# Patient Record
Sex: Female | Born: 1985 | Race: White | Hispanic: No | Marital: Married | Smoking: Former smoker
Health system: Southern US, Community
[De-identification: ages and names within clinical notes are randomized; demographics above are authoritative.]

## PROBLEM LIST (undated history)

## (undated) DIAGNOSIS — Z808 Family history of malignant neoplasm of other organs or systems: Secondary | ICD-10-CM

## (undated) DIAGNOSIS — E063 Autoimmune thyroiditis: Secondary | ICD-10-CM

## (undated) DIAGNOSIS — Z1509 Genetic susceptibility to other malignant neoplasm: Secondary | ICD-10-CM

## (undated) DIAGNOSIS — Z8051 Family history of malignant neoplasm of kidney: Secondary | ICD-10-CM

## (undated) DIAGNOSIS — Z8042 Family history of malignant neoplasm of prostate: Secondary | ICD-10-CM

## (undated) DIAGNOSIS — E282 Polycystic ovarian syndrome: Secondary | ICD-10-CM

## (undated) DIAGNOSIS — Z803 Family history of malignant neoplasm of breast: Secondary | ICD-10-CM

## (undated) DIAGNOSIS — Z15060 Genetic susceptibility to colorectal cancer: Secondary | ICD-10-CM

## (undated) HISTORY — DX: Family history of malignant neoplasm of prostate: Z80.42

## (undated) HISTORY — DX: Family history of malignant neoplasm of kidney: Z80.51

## (undated) HISTORY — DX: Autoimmune thyroiditis: E06.3

## (undated) HISTORY — PX: TUBAL LIGATION: SHX77

## (undated) HISTORY — DX: Genetic susceptibility to other malignant neoplasm: Z15.09

## (undated) HISTORY — DX: Genetic susceptibility to colorectal cancer: Z15.060

## (undated) HISTORY — DX: Family history of malignant neoplasm of breast: Z80.3

## (undated) HISTORY — DX: Polycystic ovarian syndrome: E28.2

## (undated) HISTORY — PX: CHOLECYSTECTOMY, LAPAROSCOPIC: SHX56

## (undated) HISTORY — DX: Family history of malignant neoplasm of other organs or systems: Z80.8

---

## 2019-12-20 ENCOUNTER — Ambulatory Visit: Payer: Self-pay | Admitting: Dermatology

## 2019-12-20 ENCOUNTER — Other Ambulatory Visit: Payer: Self-pay

## 2020-05-03 ENCOUNTER — Ambulatory Visit: Payer: Managed Care, Other (non HMO) | Admitting: Family Medicine

## 2020-05-15 ENCOUNTER — Ambulatory Visit (INDEPENDENT_AMBULATORY_CARE_PROVIDER_SITE_OTHER): Payer: Managed Care, Other (non HMO) | Admitting: Family Medicine

## 2020-05-15 ENCOUNTER — Encounter: Payer: Self-pay | Admitting: Family Medicine

## 2020-05-15 ENCOUNTER — Other Ambulatory Visit: Payer: Self-pay

## 2020-05-15 VITALS — BP 127/75 | HR 79 | Temp 97.8°F | Ht 65.45 in | Wt 160.5 lb

## 2020-05-15 DIAGNOSIS — Z803 Family history of malignant neoplasm of breast: Secondary | ICD-10-CM | POA: Diagnosis not present

## 2020-05-15 DIAGNOSIS — Z13220 Encounter for screening for lipoid disorders: Secondary | ICD-10-CM | POA: Diagnosis not present

## 2020-05-15 DIAGNOSIS — E282 Polycystic ovarian syndrome: Secondary | ICD-10-CM

## 2020-05-15 MED ORDER — SEMAGLUTIDE-WEIGHT MANAGEMENT 1.7 MG/0.75ML ~~LOC~~ SOAJ: 1.7000 mg | SUBCUTANEOUS | 0 refills | Status: DC

## 2020-05-15 MED ORDER — SEMAGLUTIDE-WEIGHT MANAGEMENT 1 MG/0.5ML ~~LOC~~ SOAJ: 1.0000 mg | SUBCUTANEOUS | 0 refills | Status: DC

## 2020-05-15 MED ORDER — SEMAGLUTIDE-WEIGHT MANAGEMENT 0.25 MG/0.5ML ~~LOC~~ SOAJ: 0.2500 mg | SUBCUTANEOUS | 0 refills | Status: DC

## 2020-05-15 MED ORDER — SEMAGLUTIDE-WEIGHT MANAGEMENT 0.5 MG/0.5ML ~~LOC~~ SOAJ: 0.5000 mg | SUBCUTANEOUS | 0 refills | Status: DC

## 2020-05-15 MED ORDER — SEMAGLUTIDE-WEIGHT MANAGEMENT 2.4 MG/0.75ML ~~LOC~~ SOAJ: 2.4000 mg | SUBCUTANEOUS | 3 refills | Status: DC

## 2020-05-15 NOTE — Patient Instructions (Addendum)
https://www.[employer].com/wegovy/savings-card.html -[employer] Savings card.  Have labs completed today.  Schedule pap in 3-4 months at your convenience.   You can also stop downstairs and go ahead and schedule your mammogram.

## 2020-05-16 LAB — COMPLETE METABOLIC PANEL WITH GFR
AG Ratio: 1.8 (calc) (ref 1.0–2.5)
ALT: 19 U/L (ref 6–29)
AST: 16 U/L (ref 10–30)
Albumin: 4.6 g/dL (ref 3.6–5.1)
Alkaline phosphatase (APISO): 40 U/L (ref 31–125)
BUN: 10 mg/dL (ref 7–25)
CO2: 29 mmol/L (ref 20–32)
Calcium: 10 mg/dL (ref 8.6–10.2)
Chloride: 104 mmol/L (ref 98–110)
Creat: 0.74 mg/dL (ref 0.50–1.10)
GFR, Est African American: 121 mL/min/{1.73_m2} (ref >=60)
GFR, Est Non African American: 104 mL/min/{1.73_m2} (ref >=60)
Globulin: 2.6 g/dL (calc) (ref 1.9–3.7)
Glucose, Bld: 98 mg/dL (ref 65–139)
Potassium: 5.3 mmol/L (ref 3.5–5.3)
Sodium: 141 mmol/L (ref 135–146)
Total Bilirubin: 0.3 mg/dL (ref 0.2–1.2)
Total Protein: 7.2 g/dL (ref 6.1–8.1)

## 2020-05-16 LAB — HEMOGLOBIN A1C
Hgb A1c MFr Bld: 5.2 % of total Hgb (ref <5.7)
Mean Plasma Glucose: 103 (calc)
eAG (mmol/L): 5.7 (calc)

## 2020-05-16 LAB — CBC
HCT: 41.9 % (ref 35.0–45.0)
Hemoglobin: 14.2 g/dL (ref 11.7–15.5)
MCH: 28.3 pg (ref 27.0–33.0)
MCHC: 33.9 g/dL (ref 32.0–36.0)
MCV: 83.6 fL (ref 80.0–100.0)
MPV: 10.1 fL (ref 7.5–12.5)
Platelets: 321 10*3/uL (ref 140–400)
RBC: 5.01 10*6/uL (ref 3.80–5.10)
RDW: 12.7 % (ref 11.0–15.0)
WBC: 7.1 10*3/uL (ref 3.8–10.8)

## 2020-05-16 LAB — LIPID PANEL
Cholesterol: 226 mg/dL — ABNORMAL HIGH (ref <200)
HDL: 73 mg/dL (ref >=50)
LDL Cholesterol (Calc): 137 mg/dL (calc) — ABNORMAL HIGH
Non-HDL Cholesterol (Calc): 153 mg/dL (calc) — ABNORMAL HIGH (ref <130)
Total CHOL/HDL Ratio: 3.1 (calc) (ref <5.0)
Triglycerides: 70 mg/dL (ref <150)

## 2020-05-17 ENCOUNTER — Other Ambulatory Visit: Payer: Self-pay | Admitting: Family Medicine

## 2020-05-17 DIAGNOSIS — Z803 Family history of malignant neoplasm of breast: Secondary | ICD-10-CM | POA: Insufficient documentation

## 2020-05-17 DIAGNOSIS — Z13220 Encounter for screening for lipoid disorders: Secondary | ICD-10-CM | POA: Insufficient documentation

## 2020-05-17 DIAGNOSIS — E282 Polycystic ovarian syndrome: Secondary | ICD-10-CM | POA: Insufficient documentation

## 2020-05-17 MED ORDER — SAXENDA 18 MG/3ML ~~LOC~~ SOPN: PEN_INJECTOR | SUBCUTANEOUS | 2 refills | Status: DC

## 2020-05-17 NOTE — Assessment & Plan Note (Signed)
Lipid panel ordered.

## 2020-05-17 NOTE — Progress Notes (Signed)
Debbie Gallegos - 34 y.o. female MRN [id number]  Date of birth: 1986/03/17  Subjective Chief Complaint  Patient presents with  . Establish Care    HPI Debbie Gallegos is a 34 y.o. female here today for initial visit.  She has been in pretty good health with history of PCOS.  This has been managed with metformin.  Blood sugars have been pretty well controlled.  She denies side effects from metformin.  She has had difficulty controlling her weight in the past.  She has tried phentermine previously with short term success but has been unable to maintain weight loss for prolonged periods.    She also has a family history of breast cancer in mother and maternal aunt.  She has never seen a Dietitian and family members have not had BRCA testing to her knowledge.  She was advised to start having early mammograms and was scheduled to have one prior to moving to [facility] but didn't have this completed.    ROS:  A comprehensive ROS was completed and negative except as noted per HPI  Allergies  Allergen Reactions  . Phenergan [Promethazine Hcl] Other (See Comments)    Lock jaw, neck stiffness    Past Medical History:  Diagnosis Date  . PCOS (polycystic ovarian syndrome)     Past Surgical History:  Procedure Laterality Date  . CESAREAN SECTION    . CHOLECYSTECTOMY, LAPAROSCOPIC    . TUBAL LIGATION      Social History   Socioeconomic History  . Marital status: Married    Spouse name: Debbie Gallegos  . Number of children: 2  . Years of education: Not on file  . Highest education level: High school graduate  Occupational History  . Occupation: Stay At Home Mother  Tobacco Use  . Smoking status: Former Smoker    Types: Cigarettes    Quit date: 10/11/2015    Years since quitting: 4.6  . Smokeless tobacco: Never Used  Vaping Use  . Vaping Use: Never used  Substance and Sexual Activity  . Alcohol use: Yes    Alcohol/week: 1.0 - 2.0 standard drink    Types: 1 - 2 Standard drinks or  equivalent per week  . Drug use: Never  . Sexual activity: Yes    Partners: Male    Birth control/protection: None    Comment: Tubal Ligation  Other Topics Concern  . Not on file  Social History Narrative  . Not on file   Social Determinants of Health   Financial Resource Strain:   . Difficulty of Paying Living Expenses: Not on file  Food Insecurity:   . Worried About Charity fundraiser in the Last Year: Not on file  . Ran Out of Food in the Last Year: Not on file  Transportation Needs:   . Lack of Transportation (Medical): Not on file  . Lack of Transportation (Non-Medical): Not on file  Physical Activity:   . Days of Exercise per Week: Not on file  . Minutes of Exercise per Session: Not on file  Stress:   . Feeling of Stress : Not on file  Social Connections:   . Frequency of Communication with Friends and Family: Not on file  . Frequency of Social Gatherings with Friends and Family: Not on file  . Attends Religious Services: Not on file  . Active Member of Clubs or Organizations: Not on file  . Attends Archivist Meetings: Not on file  . Marital Status: Not on file  Family History  Problem Relation Age of Onset  . Hypertension Mother   . Diabetes Mother   . Stroke Mother   . Breast cancer Mother   . Skin cancer Father   . Uterine cancer Maternal Grandmother   . Kidney cancer Maternal Grandfather   . Breast cancer Maternal Aunt     Health Maintenance  Topic Date Due  . Hepatitis C Screening  Never done  . HIV Screening  Never done  . PAP SMEAR-Modifier  Never done  . INFLUENZA VACCINE  Never done  . TETANUS/TDAP  08/12/2025  . COVID-19 Vaccine  Completed     ----------------------------------------------------------------------------------------------------------------------------------------------------------------------------------------------------------------- Physical Exam BP 127/75 (BP Location: Left Arm, Patient Position: Sitting, Cuff  Size: Normal)   Pulse 79   Temp 97.8 F (36.6 C)   Ht 5' 5.45" (1.662 m)   Wt 160 lb 8 oz (72.8 kg)   LMP 05/01/2020   SpO2 98%   BMI 26.34 kg/m   Physical Exam Constitutional:      Appearance: Normal appearance.  HENT:     Head: Normocephalic and atraumatic.  Eyes:     General: No scleral icterus. Cardiovascular:     Rate and Rhythm: Normal rate and regular rhythm.  Pulmonary:     Effort: Pulmonary effort is normal.     Breath sounds: Normal breath sounds.  Musculoskeletal:     Cervical back: Neck supple.  Skin:    General: Skin is warm and dry.  Neurological:     General: No focal deficit present.     Mental Status: She is alert.  Psychiatric:        Mood and Affect: Mood normal.        Behavior: Behavior normal.     ------------------------------------------------------------------------------------------------------------------------------------------------------------------------------------------------------------------- Assessment and Plan  PCOS (polycystic ovarian syndrome) Managed with metformin.  Has struggled to lose and maintain weight.   Medication options discussed.   Will provide trial of wegovy Updated labs ordered.   No follow-ups on file.   Family history of breast cancer in first degree relative Recommend referral to genetic counselor.  Start early screening for breast cancer, mammogram ordered.    Screening for lipid disorders Lipid panel ordered.   Orders Placed This Encounter  Procedures  . MM 3D SCREEN BREAST BILATERAL    Standing Status:   Future    Standing Expiration Date:   05/15/2021    Order Specific Question:   Reason for Exam (SYMPTOM  OR DIAGNOSIS REQUIRED)    Answer:   Family history of breast cancer in multiple relatives    Order Specific Question:   Is the patient pregnant?    Answer:   No    Order Specific Question:   Preferred imaging location?    Answer:   [facility]  . COMPLETE METABOLIC PANEL WITH GFR   . CBC  . Lipid Profile  . HgB A1c  . Ambulatory referral to Genetics    Referral Priority:   Routine    Referral Type:   Consultation    Referral Reason:   Specialty Services Required    Number of Visits Requested:   1     Meds ordered this encounter  Medications  . DISCONTD: Semaglutide-Weight Management 0.25 MG/0.5ML SOAJ    Sig: Inject 0.25 mg into the skin once a week for 28 days.    Dispense:  2 mL    Refill:  0  . DISCONTD: Semaglutide-Weight Management 0.5 MG/0.5ML SOAJ    Sig: Inject 0.5 mg into  the skin once a week for 28 days. Start after 0.62m completion    Dispense:  2 mL    Refill:  0  . DISCONTD: Semaglutide-Weight Management 1 MG/0.5ML SOAJ    Sig: Inject 1 mg into the skin once a week for 28 days.    Dispense:  2 mL    Refill:  0  . DISCONTD: Semaglutide-Weight Management 1.7 MG/0.75ML SOAJ    Sig: Inject 1.7 mg into the skin once a week for 28 days.    Dispense:  3 mL    Refill:  0  . DISCONTD: Semaglutide-Weight Management 2.4 MG/0.75ML SOAJ    Sig: Inject 2.4 mg into the skin once a week for 28 days.    Dispense:  3 mL    Refill:  3    No follow-ups on file.    This visit occurred during the SARS-CoV-2 public health emergency.  Safety protocols were in place, including screening questions prior to the visit, additional usage of staff PPE, and extensive cleaning of exam room while observing appropriate contact time as indicated for disinfecting solutions.

## 2020-05-17 NOTE — Assessment & Plan Note (Signed)
Managed with metformin.  Has struggled to lose and maintain weight.   Medication options discussed.   Will provide trial of wegovy Updated labs ordered.   No follow-ups on file.

## 2020-05-17 NOTE — Assessment & Plan Note (Signed)
Recommend referral to genetic counselor.  Start early screening for breast cancer, mammogram ordered.

## 2020-05-18 ENCOUNTER — Telehealth: Payer: Self-pay | Admitting: Genetic Counselor

## 2020-05-18 NOTE — Telephone Encounter (Signed)
Received a genetic counseling referral from Dr. Ashley Royalty for fhx of breast cancer. Mrs. Debbie Gallegos has been cld and scheduled to see Clydie Braun on 11/1 at 10am. Pt aware to arrive 15 minutes early.

## 2020-05-22 ENCOUNTER — Other Ambulatory Visit: Payer: Self-pay | Admitting: Family Medicine

## 2020-05-22 MED ORDER — PHENTERMINE HCL 37.5 MG PO CAPS: 37.5000 mg | ORAL_CAPSULE | ORAL | 2 refills | Status: DC

## 2020-05-24 ENCOUNTER — Other Ambulatory Visit: Payer: Self-pay

## 2020-05-24 ENCOUNTER — Ambulatory Visit: Payer: Managed Care, Other (non HMO)

## 2020-06-07 ENCOUNTER — Other Ambulatory Visit: Payer: Self-pay

## 2020-06-07 ENCOUNTER — Ambulatory Visit (INDEPENDENT_AMBULATORY_CARE_PROVIDER_SITE_OTHER): Payer: Managed Care, Other (non HMO)

## 2020-06-07 DIAGNOSIS — Z803 Family history of malignant neoplasm of breast: Secondary | ICD-10-CM

## 2020-06-07 DIAGNOSIS — Z1231 Encounter for screening mammogram for malignant neoplasm of breast: Secondary | ICD-10-CM

## 2020-06-12 ENCOUNTER — Inpatient Hospital Stay: Payer: Managed Care, Other (non HMO) | Attending: Genetic Counselor | Admitting: Genetic Counselor

## 2020-06-12 ENCOUNTER — Inpatient Hospital Stay: Payer: Managed Care, Other (non HMO)

## 2020-06-12 ENCOUNTER — Encounter: Payer: Self-pay | Admitting: Genetic Counselor

## 2020-06-12 ENCOUNTER — Other Ambulatory Visit: Payer: Self-pay

## 2020-06-12 DIAGNOSIS — Z803 Family history of malignant neoplasm of breast: Secondary | ICD-10-CM | POA: Diagnosis not present

## 2020-06-12 DIAGNOSIS — Z8051 Family history of malignant neoplasm of kidney: Secondary | ICD-10-CM

## 2020-06-12 DIAGNOSIS — Z8042 Family history of malignant neoplasm of prostate: Secondary | ICD-10-CM

## 2020-06-12 DIAGNOSIS — Z808 Family history of malignant neoplasm of other organs or systems: Secondary | ICD-10-CM | POA: Diagnosis not present

## 2020-06-12 NOTE — Progress Notes (Signed)
REFERRING PROVIDER: Luetta Nutting, [address] [place],  [place] [address]  PRIMARY PROVIDER:  Luetta Nutting, DO  PRIMARY REASON FOR VISIT:  1. Family history of breast cancer   2. Family history of kidney cancer   3. Family history of prostate cancer   4. Family history of melanoma      HISTORY OF PRESENT ILLNESS:   Ms. Chenette, a 34 y.o. female, was seen for a [place] cancer genetics consultation at the request of Dr. Zigmund Daniel due to a family history of cancer.  Ms. Spainhower presents to clinic today to discuss the possibility of a hereditary predisposition to cancer, genetic testing, and to further clarify her future cancer risks, as well as potential cancer risks for family members.   Ms. Sanville is a 34 y.o. female with no personal history of cancer.    CANCER HISTORY:  Oncology History   No history exists.     RISK FACTORS:  Menarche was at age 25.  First live birth at age 34.  OCP use for approximately <1 years.  Ovaries intact: yes.  Hysterectomy: no.  Menopausal status: premenopausal.  HRT use: 0 years. Colonoscopy: no; not examined. Mammogram within the last year: yes. Number of breast biopsies: 0. Up to date with pelvic exams: yes. Any excessive radiation exposure in the past: no  Past Medical History:  Diagnosis Date  . Family history of breast cancer   . Family history of kidney cancer   . Family history of melanoma   . Family history of prostate cancer   . PCOS (polycystic ovarian syndrome)     Past Surgical History:  Procedure Laterality Date  . CESAREAN SECTION    . CHOLECYSTECTOMY, LAPAROSCOPIC    . TUBAL LIGATION      Social History   Socioeconomic History  . Marital status: Married    Spouse name: Anderia Lorenzo  . Number of children: 2  . Years of education: Not on file  . Highest education level: High school graduate  Occupational History  . Occupation: Stay At Home Mother  Tobacco Use  . Smoking status:  Former Smoker    Types: Cigarettes    Quit date: 10/11/2015    Years since quitting: 4.6  . Smokeless tobacco: Never Used  Vaping Use  . Vaping Use: Never used  Substance and Sexual Activity  . Alcohol use: Yes    Alcohol/week: 1.0 - 2.0 standard drink    Types: 1 - 2 Standard drinks or equivalent per week  . Drug use: Never  . Sexual activity: Yes    Partners: Male    Birth control/protection: None    Comment: Tubal Ligation  Other Topics Concern  . Not on file  Social History Narrative  . Not on file   Social Determinants of Health   Financial Resource Strain:   . Difficulty of Paying Living Expenses: Not on file  Food Insecurity:   . Worried About Charity fundraiser in the Last Year: Not on file  . Ran Out of Food in the Last Year: Not on file  Transportation Needs:   . Lack of Transportation (Medical): Not on file  . Lack of Transportation (Non-Medical): Not on file  Physical Activity:   . Days of Exercise per Week: Not on file  . Minutes of Exercise per Session: Not on file  Stress:   . Feeling of Stress : Not on file  Social Connections:   . Frequency of  Communication with Friends and Family: Not on file  . Frequency of Social Gatherings with Friends and Family: Not on file  . Attends Religious Services: Not on file  . Active Member of Clubs or Organizations: Not on file  . Attends Archivist Meetings: Not on file  . Marital Status: Not on file     FAMILY HISTORY:  We obtained a detailed, 4-generation family history.  Significant diagnoses are listed below: Family History  Problem Relation Age of Onset  . Hypertension Mother   . Diabetes Mother   . Stroke Mother   . Breast cancer Mother 40       HER2+  . Melanoma Father        ? due to agent orange  . Other Father        cancerous nasal polyps  . Heart disease Maternal Grandmother   . Kidney cancer Maternal Grandfather 83       encapsulated  . Breast cancer Maternal Aunt 64  . Prostate  cancer Paternal Grandfather   . Uterine cancer Other 68       MGF's mother    The patient has a son and daughter who are cancer free. She has two brothers who are cancer free and a paternal half brother and sister who are cancer free.  Both parents are living.  The patient's father has had skin cancer for which he needed chemotherapy. He had a brother and sister who did not have cancer.  His father had prostate cancer and his mother is deceased.  The patient's mother had breast cancer at 20.  She has five brothers and two sisters.  One sister had breast cancer at age 66 and the other sister has had a lumpectomy, but it did not show cancer.  The maternal grandmother died of heart disease and the grandfather had kidney cancer at 95.  His mother had uterine cancer at 17.  Ms. Silberstein is unaware of previous family history of genetic testing for hereditary cancer risks. Patient's maternal ancestors are of [place] and [place] descent, and paternal ancestors are of [place] and [place] descent. There is no reported Ashkenazi Jewish ancestry. There is no known consanguinity.  GENETIC COUNSELING ASSESSMENT: Ms. Ulloa is a 34 y.o. female with a family history of cancer which is somewhat suggestive of a familial cancer syndrome, rather than a hereditary predisposition to cancer given the ages of onset of cancer in the family. We, therefore, discussed and recommended the following at today's visit.   DISCUSSION: We discussed that 5 - 10% of breast cancer is hereditary, with most cases associated with BRCA mutations.  There are other genes that can be associated with hereditary breast cancer syndromes.  These include ATM, CHEK2 and PALB2. We reviewed that her mother is a more informative person to test as her mother is the one with breast cancer.  Therefore, testing Ms. Pricer will give [place] information on her, but not about her family.     We discussed with Ms. Geissinger that the family history does not meet insurance  or NCCN criteria for genetic testing and, therefore, is not highly consistent with a familial hereditary cancer syndrome.  We feel she is at low risk to harbor a gene mutation associated with such a condition. Thus, we did not recommend any genetic testing, at this time, and recommended Ms. Arvie continue to follow the cancer screening guidelines given by her primary healthcare provider.  We did discuss that genetic testing could be performed,  but she would need to pay out of pocket.  This would cost $250.  We discussed that some people do not want to undergo genetic testing due to fear of genetic discrimination.  A federal law called the Genetic Information Non-Discrimination Act (GINA) of 2008 helps protect individuals against genetic discrimination based on their genetic test results.  It impacts both health insurance and employment.  With health insurance, it protects against increased premiums, being kicked off insurance or being forced to take a test in order to be insured.  For employment it protects against hiring, firing and promoting decisions based on genetic test results.  Health status due to a cancer diagnosis is not protected under GINA.   PLAN: Ms. Tomer did not wish to pursue genetic testing at today's visit. We understand this decision and remain available to coordinate genetic testing at any time in the future. We, therefore, recommend Ms. Rapley continue to follow the cancer screening guidelines given by her primary healthcare provider.  Based on Ms. Degante's family history, we recommended her mother, who was diagnosed with breast cancer at age 44, have genetic counseling and testing. Ms. Rodick will let us know if we can be of any assistance in coordinating genetic counseling and/or testing for this family member. She reports that her mother is scheduled for testing in February.  Lastly, we encouraged Ms. Shallenberger to remain in contact with cancer genetics annually so that we can continuously  update the family history and inform her of any changes in cancer genetics and testing that may be of benefit for this family.   Ms. Reitter questions were answered to her satisfaction today. Our contact information was provided should additional questions or concerns arise. Thank you for the referral and allowing [place] to share in the care of your patient.   Auriana Scalia P. Florene Glen, [place], [facility] Licensed, Insurance risk surveyor [email or web address] phone: [phone]  The patient was seen for a total of 35 minutes in face-to-face genetic counseling.  This patient was discussed with Drs. Magrinat, Lindi Adie and/or Burr Medico who agrees with the above.    _______________________________________________________________________ For Office Staff:  Number of people involved in session: 1 Was an Intern/ student involved with case: no

## 2020-06-27 ENCOUNTER — Other Ambulatory Visit: Payer: Self-pay

## 2020-06-27 MED ORDER — METFORMIN HCL 500 MG PO TABS: ORAL_TABLET | ORAL | 3 refills | Status: DC

## 2020-08-23 ENCOUNTER — Encounter: Payer: Self-pay | Admitting: Family Medicine

## 2021-01-29 ENCOUNTER — Encounter: Payer: Self-pay | Admitting: Family Medicine

## 2021-01-29 ENCOUNTER — Ambulatory Visit: Payer: Managed Care, Other (non HMO) | Admitting: Family Medicine

## 2021-01-29 DIAGNOSIS — M25569 Pain in unspecified knee: Secondary | ICD-10-CM | POA: Insufficient documentation

## 2021-01-29 DIAGNOSIS — M25561 Pain in right knee: Secondary | ICD-10-CM

## 2021-01-29 NOTE — Progress Notes (Signed)
Debbie Gallegos - 35 y.o. female MRN [id number]  Date of birth: Jun 14, 1986  Subjective Chief Complaint  Patient presents with   Knee Pain    HPI Debbie Gallegos is a 35 y.o. female here today with complaint of R knee pain.  She first noticed a few days ago.  Pain in located on the medial part of the knee.  She denies injury however she has recently started exercising more.   There is no swelling.  Pain is not too bothersome with weight bearing of movement.   ROS:  A comprehensive ROS was completed and negative except as noted per HPI  Allergies  Allergen Reactions   Phenergan [Promethazine Hcl] Other (See Comments)    Lock jaw, neck stiffness    Past Medical History:  Diagnosis Date   Family history of breast cancer    Family history of kidney cancer    Family history of melanoma    Family history of prostate cancer    PCOS (polycystic ovarian syndrome)     Past Surgical History:  Procedure Laterality Date   CESAREAN SECTION     CHOLECYSTECTOMY, LAPAROSCOPIC     TUBAL LIGATION      Social History   Socioeconomic History   Marital status: Married    Spouse name: Naeemah Jasmer   Number of children: 2   Years of education: Not on file   Highest education level: High school graduate  Occupational History   Occupation: Stay At Home Mother  Tobacco Use   Smoking status: Former    Pack years: 0.00    Types: Cigarettes    Quit date: 10/11/2015    Years since quitting: 5.3   Smokeless tobacco: Never  Vaping Use   Vaping Use: Never used  Substance and Sexual Activity   Alcohol use: Yes    Alcohol/week: 1.0 - 2.0 standard drink    Types: 1 - 2 Standard drinks or equivalent per week   Drug use: Never   Sexual activity: Yes    Partners: Male    Birth control/protection: None    Comment: Tubal Ligation  Other Topics Concern   Not on file  Social History Narrative   Not on file   Social Determinants of Health   Financial Resource Strain: Not on file  Food Insecurity:  Not on file  Transportation Needs: Not on file  Physical Activity: Not on file  Stress: Not on file  Social Connections: Not on file    Family History  Problem Relation Age of Onset   Hypertension Mother    Diabetes Mother    Stroke Mother    Breast cancer Mother 85       HER2+   Melanoma Father        ? due to agent orange   Other Father        cancerous nasal polyps   Heart disease Maternal Grandmother    Kidney cancer Maternal Grandfather 83       encapsulated   Breast cancer Maternal Aunt 54   Prostate cancer Paternal Grandfather    Uterine cancer Other 8       MGF's mother    Health Maintenance  Topic Date Due   HIV Screening  Never done   Hepatitis C Screening  Never done   PAP SMEAR-Modifier  Never done   COVID-19 Vaccine (3 - Booster for Pfizer series) 10/09/2020   INFLUENZA VACCINE  03/12/2021   TETANUS/TDAP  08/12/2025   Pneumococcal Vaccine 79-4 Years old  Aged Out   HPV VACCINES  Aged Out     ----------------------------------------------------------------------------------------------------------------------------------------------------------------------------------------------------------------- Physical Exam BP 131/73 (BP Location: Left Arm, Patient Position: Sitting, Cuff Size: Normal)   Pulse 73   Temp 97.9 F (36.6 C)   Wt 165 lb (74.8 kg)   SpO2 98%   BMI 27.08 kg/m   Physical Exam Constitutional:      Appearance: Normal appearance.  Musculoskeletal:     Comments: R knee normal to inspection. No effusion noted.  Mild TTP along medical portion of knee.  Pain with flexion. MCL/LCL and ACL/PCL without laxity.  Meniscal provocation testing unremarkable.   Neurological:     General: No focal deficit present.     Mental Status: She is alert.  Psychiatric:        Mood and Affect: Mood normal.        Behavior: Behavior normal.     ------------------------------------------------------------------------------------------------------------------------------------------------------------------------------------------------------------------- Assessment and Plan  Knee pain Seem to be MCL strain.  Recommend topical anti-inlammatory such as Voltaren gel.  Icing after Alycia Rossetti is also recommended.  Limit exercise that cause deep knee flexion for the nex week.  Call if not improving.     No orders of the defined types were placed in this encounter.   No follow-ups on file.    This visit occurred during the SARS-CoV-2 public health emergency.  Safety protocols were in place, including screening questions prior to the visit, additional usage of staff PPE, and extensive cleaning of exam room while observing appropriate contact time as indicated for disinfecting solutions.

## 2021-01-29 NOTE — Assessment & Plan Note (Signed)
Seem to be MCL strain.  Recommend topical anti-inlammatory such as Voltaren gel.  Icing after Lionel December is also recommended.  Limit exercise that cause deep knee flexion for the nex week.  Call if not improving.

## 2021-01-29 NOTE — Patient Instructions (Signed)
Lay off exercises that involve bending the knee for the next week (squats, lunges, etc) Try voltaren (diclofenac) gel to area. You can get this over the counter Let me know if not getting better with this.

## 2021-02-19 ENCOUNTER — Other Ambulatory Visit: Payer: Self-pay

## 2021-02-19 ENCOUNTER — Ambulatory Visit (INDEPENDENT_AMBULATORY_CARE_PROVIDER_SITE_OTHER): Payer: Managed Care, Other (non HMO)

## 2021-02-19 ENCOUNTER — Other Ambulatory Visit: Payer: Self-pay | Admitting: Family Medicine

## 2021-02-19 DIAGNOSIS — M25561 Pain in right knee: Secondary | ICD-10-CM

## 2021-03-02 ENCOUNTER — Other Ambulatory Visit: Payer: Self-pay | Admitting: Family Medicine

## 2021-05-30 ENCOUNTER — Other Ambulatory Visit: Payer: Self-pay | Admitting: Family Medicine

## 2021-05-31 ENCOUNTER — Encounter: Payer: Self-pay | Admitting: Family Medicine

## 2021-05-31 ENCOUNTER — Other Ambulatory Visit: Payer: Self-pay | Admitting: Family Medicine

## 2021-05-31 ENCOUNTER — Ambulatory Visit: Payer: Managed Care, Other (non HMO) | Admitting: Family Medicine

## 2021-05-31 VITALS — BP 132/73 | HR 88 | Temp 98.2°F | Ht 65.0 in | Wt 184.5 lb

## 2021-05-31 DIAGNOSIS — R635 Abnormal weight gain: Secondary | ICD-10-CM | POA: Diagnosis not present

## 2021-05-31 DIAGNOSIS — N644 Mastodynia: Secondary | ICD-10-CM | POA: Diagnosis not present

## 2021-05-31 MED ORDER — WEGOVY 0.25 MG/0.5ML ~~LOC~~ SOAJ: 0.2500 mg | SUBCUTANEOUS | 1 refills | Status: DC

## 2021-05-31 MED ORDER — PHENTERMINE HCL 37.5 MG PO CAPS: 37.5000 mg | ORAL_CAPSULE | ORAL | 2 refills | Status: DC

## 2021-05-31 NOTE — Progress Notes (Signed)
Debbie Gallegos - 35 y.o. female MRN [id number]  Date of birth: November 02, 1985  Subjective Chief Complaint  Patient presents with   Breast Pain    HPI Debbie Gallegos is a 35 year old female here today with complaint of left breast pain.  Her mother has a history of breast cancer which has her more concerned.  She has not noticed any nodules or lumps.  She did have a normal mammogram in 2021.  She does not recall any injury or overuse to this area.  Pain is described as sharp with radiation from the lateral breast towards the nipple area.  She is also concerned about continued weight gain.  She has history of PCOS which she takes metformin for.  She has taken phentermine in the past and would like to try restarting this.  She had fairly good success with this previously.  Her insurance has not covered [place].  ROS:  A comprehensive ROS was completed and negative except as noted per HPI  Allergies  Allergen Reactions   Phenergan [Promethazine Hcl] Other (See Comments)    Lock jaw, neck stiffness    Past Medical History:  Diagnosis Date   Family history of breast cancer    Family history of kidney cancer    Family history of melanoma    Family history of prostate cancer    PCOS (polycystic ovarian syndrome)     Past Surgical History:  Procedure Laterality Date   CESAREAN SECTION     CHOLECYSTECTOMY, LAPAROSCOPIC     TUBAL LIGATION      Social History   Socioeconomic History   Marital status: Married    Spouse name: Farzana Koci   Number of children: 2   Years of education: Not on file   Highest education level: High school graduate  Occupational History   Occupation: Stay At Home Mother  Tobacco Use   Smoking status: Former    Types: Cigarettes    Quit date: 10/11/2015    Years since quitting: 5.6   Smokeless tobacco: Never  Vaping Use   Vaping Use: Never used  Substance and Sexual Activity   Alcohol use: Yes    Alcohol/week: 1.0 - 2.0 standard drink    Types: 1 - 2 Standard  drinks or equivalent per week   Drug use: Never   Sexual activity: Yes    Partners: Male    Birth control/protection: None    Comment: Tubal Ligation  Other Topics Concern   Not on file  Social History Narrative   Not on file   Social Determinants of Health   Financial Resource Strain: Not on file  Food Insecurity: Not on file  Transportation Needs: Not on file  Physical Activity: Not on file  Stress: Not on file  Social Connections: Not on file    Family History  Problem Relation Age of Onset   Hypertension Mother    Diabetes Mother    Stroke Mother    Breast cancer Mother 23       HER2+   Melanoma Father        ? due to agent orange   Other Father        cancerous nasal polyps   Heart disease Maternal Grandmother    Kidney cancer Maternal Grandfather 83       encapsulated   Breast cancer Maternal Aunt 54   Prostate cancer Paternal Grandfather    Uterine cancer Other 34       MGF's mother    Health Maintenance  Topic Date Due   HIV Screening  Never done   Hepatitis C Screening  Never done   PAP SMEAR-Modifier  Never done   COVID-19 Vaccine (3 - Booster for Pfizer series) 07/05/2020   INFLUENZA VACCINE  Never done   TETANUS/TDAP  08/12/2025   Pneumococcal Vaccine 32-44 Years old  Aged Out   HPV VACCINES  Aged Out     ----------------------------------------------------------------------------------------------------------------------------------------------------------------------------------------------------------------- Physical Exam BP 132/73 (BP Location: Left Arm, Patient Position: Sitting, Cuff Size: Normal)   Pulse 88   Temp 98.2 F (36.8 C)   Ht _0  (1.651 m)   Wt 184 lb 8 oz (83.7 kg)   SpO2 100%   BMI 30.70 kg/m   Physical Exam Exam conducted with a chaperone present Leontine Locket, CMA).  Constitutional:      Appearance: Normal appearance.  Eyes:     General: No scleral icterus. Chest:  Breasts:    Breasts are symmetrical.      Right: Normal.     Left: Tenderness (Mild tenderness along the lateral breast without palpable mass.) present. No swelling, bleeding, inverted nipple, mass, nipple discharge or skin change.     Comments: Bilateral nipple piercings noted. Musculoskeletal:     Cervical back: Neck supple.  Lymphadenopathy:     Upper Body:     Right upper body: No supraclavicular, axillary or pectoral adenopathy.     Left upper body: No supraclavicular, axillary or pectoral adenopathy.  Neurological:     General: No focal deficit present.     Mental Status: She is alert.  Psychiatric:        Mood and Affect: Mood normal.        Behavior: Behavior normal.    ------------------------------------------------------------------------------------------------------------------------------------------------------------------------------------------------------------------- Assessment and Plan  Abnormal weight gain Adding phentermine back on for a period of 3 months.  We discussed transitioning to lower dose phentermine and topiramate after these 3 months for maintenance.  She will continue to work on dietary changes.  She is exercising frequently already.  Breast pain, left Referral placed for diagnostic mammogram for further evaluation of left breast pain.   Meds ordered this encounter  Medications   DISCONTD: Semaglutide-Weight Management (WEGOVY) 0.25 MG/0.5ML SOAJ    Sig: Inject 0.25 mg into the skin once a week.    Dispense:  2 mL    Refill:  1   phentermine 37.5 MG capsule    Sig: Take 1 capsule (37.5 mg total) by mouth every morning.    Dispense:  30 capsule    Refill:  2    Return in about 3 months (around 08/31/2021) for Weight mgmt.    This visit occurred during the SARS-CoV-2 public health emergency.  Safety protocols were in place, including screening questions prior to the visit, additional usage of staff PPE, and extensive cleaning of exam room while observing appropriate contact time as  indicated for disinfecting solutions.

## 2021-05-31 NOTE — Assessment & Plan Note (Signed)
Adding phentermine back on for a period of 3 months.  We discussed transitioning to lower dose phentermine and topiramate after these 3 months for maintenance.  She will continue to work on dietary changes.  She is exercising frequently already.

## 2021-05-31 NOTE — Assessment & Plan Note (Signed)
Referral placed for diagnostic mammogram for further evaluation of left breast pain.

## 2021-06-06 ENCOUNTER — Other Ambulatory Visit: Payer: Self-pay | Admitting: Family Medicine

## 2021-06-06 DIAGNOSIS — N644 Mastodynia: Secondary | ICD-10-CM

## 2021-06-27 ENCOUNTER — Other Ambulatory Visit: Payer: Self-pay | Admitting: Family Medicine

## 2021-07-13 ENCOUNTER — Other Ambulatory Visit: Payer: Managed Care, Other (non HMO)

## 2021-08-17 ENCOUNTER — Ambulatory Visit
Admission: RE | Admit: 2021-08-17 | Discharge: 2021-08-17 | Disposition: A | Discharge status: Home / Self Care | Payer: Managed Care, Other (non HMO) | Source: Ambulatory Visit | Attending: Family Medicine | Admitting: Family Medicine

## 2021-08-17 DIAGNOSIS — N644 Mastodynia: Secondary | ICD-10-CM

## 2021-08-31 ENCOUNTER — Other Ambulatory Visit (HOSPITAL_COMMUNITY)
Admission: RE | Admit: 2021-08-31 | Discharge: 2021-08-31 | Disposition: A | Discharge status: Home / Self Care | Payer: Managed Care, Other (non HMO) | Source: Ambulatory Visit | Attending: Family Medicine | Admitting: Family Medicine

## 2021-08-31 ENCOUNTER — Ambulatory Visit (INDEPENDENT_AMBULATORY_CARE_PROVIDER_SITE_OTHER): Payer: Managed Care, Other (non HMO) | Admitting: Family Medicine

## 2021-08-31 ENCOUNTER — Encounter: Payer: Self-pay | Admitting: Family Medicine

## 2021-08-31 ENCOUNTER — Other Ambulatory Visit: Payer: Self-pay

## 2021-08-31 VITALS — BP 134/83 | HR 115 | Temp 98.2°F | Ht 65.0 in | Wt 168.0 lb

## 2021-08-31 DIAGNOSIS — R635 Abnormal weight gain: Secondary | ICD-10-CM | POA: Diagnosis not present

## 2021-08-31 DIAGNOSIS — E282 Polycystic ovarian syndrome: Secondary | ICD-10-CM

## 2021-08-31 DIAGNOSIS — Z0000 Encounter for general adult medical examination without abnormal findings: Secondary | ICD-10-CM

## 2021-08-31 DIAGNOSIS — Z1151 Encounter for screening for human papillomavirus (HPV): Secondary | ICD-10-CM | POA: Diagnosis present

## 2021-08-31 DIAGNOSIS — Z13220 Encounter for screening for lipoid disorders: Secondary | ICD-10-CM | POA: Diagnosis not present

## 2021-08-31 MED ORDER — PHENTERMINE HCL 15 MG PO CAPS: 15.0000 mg | ORAL_CAPSULE | ORAL | 4 refills | Status: DC

## 2021-08-31 NOTE — Patient Instructions (Signed)
Preventive Care 21-39 Years Old, Female °Preventive care refers to lifestyle choices and visits with your health care provider that can promote health and wellness. Preventive care visits are also called wellness exams. °What can I expect for my preventive care visit? °Counseling °During your preventive care visit, your health care provider may ask about your: °Medical history, including: °Past medical problems. °Family medical history. °Pregnancy history. °Current health, including: °Menstrual cycle. °Method of birth control. °Emotional well-being. °Home life and relationship well-being. °Sexual activity and sexual health. °Lifestyle, including: °Alcohol, nicotine or tobacco, and drug use. °Access to firearms. °Diet, exercise, and sleep habits. °Work and work environment. °Sunscreen use. °Safety issues such as seatbelt and bike helmet use. °Physical exam °Your health care provider may check your: °Height and weight. These may be used to calculate your BMI (body mass index). BMI is a measurement that tells if you are at a healthy weight. °Waist circumference. This measures the distance around your waistline. This measurement also tells if you are at a healthy weight and may help predict your risk of certain diseases, such as type 2 diabetes and high blood pressure. °Heart rate and blood pressure. °Body temperature. °Skin for abnormal spots. °What immunizations do I need? °Vaccines are usually given at various ages, according to a schedule. Your health care provider will recommend vaccines for you based on your age, medical history, and lifestyle or other factors, such as travel or where you work. °What tests do I need? °Screening °Your health care provider may recommend screening tests for certain conditions. This may include: °Pelvic exam and Pap test. °Lipid and cholesterol levels. °Diabetes screening. This is done by checking your blood sugar (glucose) after you have not eaten for a while (fasting). °Hepatitis B  test. °Hepatitis C test. °HIV (human immunodeficiency virus) test. °STI (sexually transmitted infection) testing, if you are at risk. °BRCA-related cancer screening. This may be done if you have a family history of breast, ovarian, tubal, or peritoneal cancers. °Talk with your health care provider about your test results, treatment options, and if necessary, the need for more tests. °Follow these instructions at home: °Eating and drinking ° °Eat a healthy diet that includes fresh fruits and vegetables, whole grains, lean protein, and low-fat dairy products. °Take vitamin and mineral supplements as recommended by your health care provider. °Do not drink alcohol if: °Your health care provider tells you not to drink. °You are pregnant, may be pregnant, or are planning to become pregnant. °If you drink alcohol: °Limit how much you have to 0-1 drink a day. °Know how much alcohol is in your drink. In the [place] one drink equals one 12 oz bottle of beer (355 mL), one 5 oz glass of wine (148 mL), or one 1½ oz glass of hard liquor (44 mL). °Lifestyle °Brush your teeth every morning and night with fluoride toothpaste. Floss one time each day. °Exercise for at least 30 minutes 5 or more days each week. °Do not use any products that contain nicotine or tobacco. These products include cigarettes, chewing tobacco, and vaping devices, such as e-cigarettes. If you need help quitting, ask your health care provider. °Do not use drugs. °If you are sexually active, practice safe sex. Use a condom or other form of protection to prevent STIs. °If you do not wish to become pregnant, use a form of birth control. If you plan to become pregnant, see your health care provider for a prepregnancy visit. °Find healthy ways to manage stress, such as: °Meditation, yoga,   or listening to music. °Journaling. °Talking to a trusted person. °Spending time with friends and family. °Minimize exposure to UV radiation to reduce your risk of skin  cancer. °Safety °Always wear your seat belt while driving or riding in a vehicle. °Do not drive: °If you have been drinking alcohol. Do not ride with someone who has been drinking. °If you have been using any mind-altering substances or drugs. °While texting. °When you are tired or distracted. °Wear a helmet and other protective equipment during sports activities. °If you have firearms in your house, make sure you follow all gun safety procedures. °Seek help if you have been physically or sexually abused. °What's next? °Go to your health care provider once a year for an annual wellness visit. °Ask your health care provider how often you should have your eyes and teeth checked. °Stay up to date on all vaccines. °This information is not intended to replace advice given to you by your health care provider. Make sure you discuss any questions you have with your health care provider. °Document Revised: 01/24/2021 Document Reviewed: 01/24/2021 °Elsevier Patient Education © 2022 [employer] ° °

## 2021-09-01 LAB — CBC WITH DIFFERENTIAL/PLATELET
Absolute Monocytes: 299 cells/uL (ref 200–950)
Basophils Absolute: 43 cells/uL (ref 0–200)
Basophils Relative: 0.7 %
Eosinophils Absolute: 183 cells/uL (ref 15–500)
Eosinophils Relative: 3 %
HCT: 43.2 % (ref 35.0–45.0)
Hemoglobin: 14.6 g/dL (ref 11.7–15.5)
Lymphs Abs: 1787 cells/uL (ref 850–3900)
MCH: 28.8 pg (ref 27.0–33.0)
MCHC: 33.8 g/dL (ref 32.0–36.0)
MCV: 85.2 fL (ref 80.0–100.0)
MPV: 10.7 fL (ref 7.5–12.5)
Monocytes Relative: 4.9 %
Neutro Abs: 3788 cells/uL (ref 1500–7800)
Neutrophils Relative %: 62.1 %
Platelets: 306 10*3/uL (ref 140–400)
RBC: 5.07 10*6/uL (ref 3.80–5.10)
RDW: 13.1 % (ref 11.0–15.0)
Total Lymphocyte: 29.3 %
WBC: 6.1 10*3/uL (ref 3.8–10.8)

## 2021-09-01 LAB — COMPLETE METABOLIC PANEL WITH GFR
AG Ratio: 1.9 (calc) (ref 1.0–2.5)
ALT: 11 U/L (ref 6–29)
AST: 11 U/L (ref 10–30)
Albumin: 4.6 g/dL (ref 3.6–5.1)
Alkaline phosphatase (APISO): 33 U/L (ref 31–125)
BUN: 12 mg/dL (ref 7–25)
CO2: 30 mmol/L (ref 20–32)
Calcium: 9.7 mg/dL (ref 8.6–10.2)
Chloride: 107 mmol/L (ref 98–110)
Creat: 0.73 mg/dL (ref 0.50–0.97)
Globulin: 2.4 g/dL (calc) (ref 1.9–3.7)
Glucose, Bld: 93 mg/dL (ref 65–99)
Potassium: 5 mmol/L (ref 3.5–5.3)
Sodium: 142 mmol/L (ref 135–146)
Total Bilirubin: 0.4 mg/dL (ref 0.2–1.2)
Total Protein: 7 g/dL (ref 6.1–8.1)
eGFR: 108 mL/min/{1.73_m2} (ref >=60)

## 2021-09-01 LAB — HEMOGLOBIN A1C
Hgb A1c MFr Bld: 5.3 % of total Hgb (ref <5.7)
Mean Plasma Glucose: 105 mg/dL
eAG (mmol/L): 5.8 mmol/L

## 2021-09-01 LAB — LIPID PANEL W/REFLEX DIRECT LDL
Cholesterol: 209 mg/dL — ABNORMAL HIGH (ref <200)
HDL: 67 mg/dL (ref >=50)
LDL Cholesterol (Calc): 126 mg/dL (calc) — ABNORMAL HIGH
Non-HDL Cholesterol (Calc): 142 mg/dL (calc) — ABNORMAL HIGH (ref <130)
Total CHOL/HDL Ratio: 3.1 (calc) (ref <5.0)
Triglycerides: 69 mg/dL (ref <150)

## 2021-09-01 LAB — TSH: TSH: 2.64 mIU/L

## 2021-09-02 DIAGNOSIS — Z0000 Encounter for general adult medical examination without abnormal findings: Secondary | ICD-10-CM | POA: Insufficient documentation

## 2021-09-02 NOTE — Assessment & Plan Note (Signed)
We will continue phentermine but at reduced dose of 15 mg daily.  Continue to focus on diet and exercise change.

## 2021-09-02 NOTE — Progress Notes (Signed)
Debbie Gallegos - 36 y.o. female MRN [id number]  Date of birth: 1985-11-29  Subjective Chief Complaint  Patient presents with   Follow-up    HPI Debbie Gallegos is a 35 year old female here today for annual exam.  Reports overall she is doing well.  She has done well with phentermine for weight management.  And like to continue if possible.  She is exercising regularly and feels like her diet is fairly healthy.  She is a non-smoker.  She consumes alcohol occasionally.  She is due for updated Pap.  She did have recent breast imaging which returned benign.  Mother has history of breast cancer.  Review of Systems  Constitutional:  Negative for chills, fever, malaise/fatigue and weight loss.  HENT:  Negative for congestion, ear pain and sore throat.   Eyes:  Negative for blurred vision, double vision and pain.  Respiratory:  Negative for cough and shortness of breath.   Cardiovascular:  Negative for chest pain and palpitations.  Gastrointestinal:  Negative for abdominal pain, blood in stool, constipation, heartburn and nausea.  Genitourinary:  Negative for dysuria and urgency.  Musculoskeletal:  Negative for joint pain and myalgias.  Neurological:  Negative for dizziness and headaches.  Endo/Heme/Allergies:  Does not bruise/bleed easily.  Psychiatric/Behavioral:  Negative for depression. The patient is not nervous/anxious and does not have insomnia.    Allergies  Allergen Reactions   Phenergan [Promethazine Hcl] Other (See Comments)    Lock jaw, neck stiffness    Past Medical History:  Diagnosis Date   Family history of breast cancer    Family history of kidney cancer    Family history of melanoma    Family history of prostate cancer    PCOS (polycystic ovarian syndrome)     Past Surgical History:  Procedure Laterality Date   CESAREAN SECTION     CHOLECYSTECTOMY, LAPAROSCOPIC     TUBAL LIGATION      Social History   Socioeconomic History   Marital status: Married     Spouse name: [name]   Number of children: 2   Years of education: Not on file   Highest education level: High school graduate  Occupational History   Occupation: Stay At Home Mother  Tobacco Use   Smoking status: Former    Types: Cigarettes    Quit date: 10/11/2015    Years since quitting: 5.8   Smokeless tobacco: Never  Vaping Use   Vaping Use: Never used  Substance and Sexual Activity   Alcohol use: Yes    Alcohol/week: 1.0 - 2.0 standard drink    Types: 1 - 2 Standard drinks or equivalent per week   Drug use: Never   Sexual activity: Yes    Partners: Male    Birth control/protection: None    Comment: Tubal Ligation  Other Topics Concern   Not on file  Social History Narrative   Not on file   Social Determinants of Health   Financial Resource Strain: Not on file  Food Insecurity: Not on file  Transportation Needs: Not on file  Physical Activity: Not on file  Stress: Not on file  Social Connections: Not on file    Family History  Problem Relation Age of Onset   Hypertension Mother    Diabetes Mother    Stroke Mother    Breast cancer Mother 44       HER2+   Melanoma Father        ? due to agent orange   Other  Father        cancerous nasal polyps   Heart disease Maternal Grandmother    Kidney cancer Maternal Grandfather 83       encapsulated   Breast cancer Maternal Aunt 54   Prostate cancer Paternal Grandfather    Uterine cancer Other 31       MGF's mother    Health Maintenance  Topic Date Due   HIV Screening  Never done   Hepatitis C Screening  Never done   PAP SMEAR-Modifier  Never done   COVID-19 Vaccine (3 - Booster for Pfizer series) 07/05/2020   INFLUENZA VACCINE  11/09/2021 (Originally 03/12/2021)   TETANUS/TDAP  08/12/2025   HPV VACCINES  Aged Out      ----------------------------------------------------------------------------------------------------------------------------------------------------------------------------------------------------------------- Physical Exam BP 134/83 (BP Location: Left Arm, Patient Position: Sitting, Cuff Size: Normal)    Pulse (!) 115    Temp 98.2 F (36.8 C)    Ht 5' 5" (1.651 m)    Wt 168 lb (76.2 kg)    SpO2 96%    BMI 27.96 kg/m   Physical Exam Exam conducted with a chaperone present Leontine Locket, CMA).  Constitutional:      General: She is not in acute distress. HENT:     Head: Normocephalic and atraumatic.     Right Ear: Tympanic membrane and ear canal normal.     Left Ear: Tympanic membrane and ear canal normal.     Nose: Nose normal.  Eyes:     General: No scleral icterus.    Conjunctiva/sclera: Conjunctivae normal.  Neck:     Thyroid: No thyromegaly.  Cardiovascular:     Rate and Rhythm: Normal rate and regular rhythm.     Heart sounds: Normal heart sounds.  Pulmonary:     Effort: Pulmonary effort is normal.     Breath sounds: Normal breath sounds.  Abdominal:     General: Bowel sounds are normal. There is no distension.     Palpations: Abdomen is soft.     Tenderness: There is no abdominal tenderness. There is no guarding.  Genitourinary:    General: Normal vulva.     Vagina: Normal.     Cervix: Friability present. No cervical motion tenderness or discharge.     Uterus: Normal.      Adnexa: Right adnexa normal and left adnexa normal.     Comments: Pap collected today Musculoskeletal:        General: Normal range of motion.     Cervical back: Normal range of motion and neck supple.  Lymphadenopathy:     Cervical: No cervical adenopathy.  Skin:    General: Skin is warm and dry.     Findings: No rash.  Neurological:     General: No focal deficit present.     Mental Status: She is alert and oriented to person, place, and time.     Cranial Nerves: No cranial nerve  deficit.     Coordination: Coordination normal.  Psychiatric:        Mood and Affect: Mood normal.        Behavior: Behavior normal.    ------------------------------------------------------------------------------------------------------------------------------------------------------------------------------------------------------------------- Assessment and Plan  Well adult exam Well adult Orders Placed This Encounter  Procedures   COMPLETE METABOLIC PANEL WITH GFR   CBC with Differential   Lipid Panel w/reflex Direct LDL   TSH   HgB A1c  Screenings: Pap collected today.  Additional screenings per lab orders. Immunizations: Up-to-date Anticipatory guidance/risk factor reduction: Recommendations per AVS.  Abnormal weight  gain We will continue phentermine but at reduced dose of 15 mg daily.  Continue to focus on diet and exercise change.   Meds ordered this encounter  Medications   phentermine 15 MG capsule    Sig: Take 1 capsule (15 mg total) by mouth every morning.    Dispense:  30 capsule    Refill:  4    No follow-ups on file.    This visit occurred during the SARS-CoV-2 public health emergency.  Safety protocols were in place, including screening questions prior to the visit, additional usage of staff PPE, and extensive cleaning of exam room while observing appropriate contact time as indicated for disinfecting solutions.

## 2021-09-02 NOTE — Assessment & Plan Note (Signed)
Well adult Orders Placed This Encounter  Procedures   COMPLETE METABOLIC PANEL WITH GFR   CBC with Differential   Lipid Panel w/reflex Direct LDL   TSH   HgB A1c  Screenings: Pap collected today.  Additional screenings per lab orders. Immunizations: Up-to-date Anticipatory guidance/risk factor reduction: Recommendations per AVS.

## 2021-09-03 LAB — CYTOLOGY - PAP
Comment: NEGATIVE
Diagnosis: NEGATIVE
High risk HPV: NEGATIVE

## 2021-09-04 ENCOUNTER — Encounter: Payer: Self-pay | Admitting: Family Medicine

## 2021-09-18 ENCOUNTER — Other Ambulatory Visit: Payer: Self-pay

## 2021-09-18 ENCOUNTER — Encounter: Payer: Self-pay | Admitting: Family Medicine

## 2021-09-18 ENCOUNTER — Ambulatory Visit: Payer: Managed Care, Other (non HMO) | Admitting: Family Medicine

## 2021-09-18 VITALS — BP 135/85 | HR 82 | Temp 98.2°F | Ht 65.0 in | Wt 164.0 lb

## 2021-09-18 DIAGNOSIS — J189 Pneumonia, unspecified organism: Secondary | ICD-10-CM | POA: Diagnosis not present

## 2021-09-18 DIAGNOSIS — J069 Acute upper respiratory infection, unspecified: Secondary | ICD-10-CM

## 2021-09-18 DIAGNOSIS — Z20822 Contact with and (suspected) exposure to covid-19: Secondary | ICD-10-CM | POA: Diagnosis not present

## 2021-09-18 LAB — POCT INFLUENZA A/B
Influenza A, POC: NEGATIVE
Influenza B, POC: NEGATIVE

## 2021-09-18 MED ORDER — AZITHROMYCIN 250 MG PO TABS: ORAL_TABLET | ORAL | 0 refills | Status: AC

## 2021-09-18 MED ORDER — HYDROCODONE BIT-HOMATROP MBR 5-1.5 MG/5ML PO SOLN: 5.0000 mL | Freq: Three times a day (TID) | ORAL | 0 refills | Status: DC | PRN

## 2021-09-18 NOTE — Progress Notes (Signed)
Debbie Gallegos - 36 y.o. female MRN [id number]  Date of birth: October 15, 1985  Subjective Chief Complaint  Patient presents with   URI    HPI Debbie Gallegos is a 36 year old female here today with complaint of 1 week history of fatigue, cough and burning sensation in her chest.  She did have low-grade fever over the weekend which has since resolved.  She has not noted any wheezing.  She denies body aches.  She has not felt shortness of breath, nausea, vomiting or diarrhea.  She is using DayQuil/NyQuil as needed.  She has not tested for COVID at home.  ROS:  A comprehensive ROS was completed and negative except as noted per HPI  Allergies  Allergen Reactions   Phenergan [Promethazine Hcl] Other (See Comments)    Lock jaw, neck stiffness    Past Medical History:  Diagnosis Date   Family history of breast cancer    Family history of kidney cancer    Family history of melanoma    Family history of prostate cancer    PCOS (polycystic ovarian syndrome)     Past Surgical History:  Procedure Laterality Date   CESAREAN SECTION     CHOLECYSTECTOMY, LAPAROSCOPIC     TUBAL LIGATION      Social History   Socioeconomic History   Marital status: Married    Spouse name: Gerianne Simonet   Number of children: 2   Years of education: Not on file   Highest education level: High school graduate  Occupational History   Occupation: Stay At Home Mother  Tobacco Use   Smoking status: Former    Types: Cigarettes    Quit date: 10/11/2015    Years since quitting: 5.9   Smokeless tobacco: Never  Vaping Use   Vaping Use: Never used  Substance and Sexual Activity   Alcohol use: Yes    Alcohol/week: 1.0 - 2.0 standard drink    Types: 1 - 2 Standard drinks or equivalent per week   Drug use: Never   Sexual activity: Yes    Partners: Male    Birth control/protection: None    Comment: Tubal Ligation  Other Topics Concern   Not on file  Social History Narrative   Not on file   Social Determinants  of Health   Financial Resource Strain: Not on file  Food Insecurity: Not on file  Transportation Needs: Not on file  Physical Activity: Not on file  Stress: Not on file  Social Connections: Not on file    Family History  Problem Relation Age of Onset   Hypertension Mother    Diabetes Mother    Stroke Mother    Breast cancer Mother 18       HER2+   Melanoma Father        ? due to agent orange   Other Father        cancerous nasal polyps   Heart disease Maternal Grandmother    Kidney cancer Maternal Grandfather 83       encapsulated   Breast cancer Maternal Aunt 54   Prostate cancer Paternal Grandfather    Uterine cancer Other 86       MGF's mother    Health Maintenance  Topic Date Due   HIV Screening  Never done   Hepatitis C Screening  Never done   COVID-19 Vaccine (3 - Booster for [employer] series) 07/05/2020   INFLUENZA VACCINE  11/09/2021 (Originally 03/12/2021)   TETANUS/TDAP  08/12/2025   PAP SMEAR-Modifier  08/31/2026  HPV VACCINES  Aged Out     ----------------------------------------------------------------------------------------------------------------------------------------------------------------------------------------------------------------- Physical Exam BP 135/85 (BP Location: Left Arm, Patient Position: Sitting, Cuff Size: Normal)    Pulse 82    Temp 98.2 F (36.8 C) (Oral)    Ht 5' 5"  (1.651 m)    Wt 164 lb (74.4 kg)    SpO2 96%    BMI 27.29 kg/m   Physical Exam Constitutional:      Appearance: Normal appearance.  Eyes:     General: No scleral icterus. Cardiovascular:     Rate and Rhythm: Normal rate and regular rhythm.  Pulmonary:     Effort: Pulmonary effort is normal.     Comments: Slight diminished breath sounds in the left lower base. Musculoskeletal:     Cervical back: Neck supple.  Neurological:     General: No focal deficit present.     Mental Status: She is alert.  Psychiatric:        Mood and Affect: Mood normal.         Behavior: Behavior normal.    ------------------------------------------------------------------------------------------------------------------------------------------------------------------------------------------------------------------- Assessment and Plan  Atypical pneumonia Symptoms and exam consistent with atypical and/or viral pneumonia.  COVID swab collected today.  Will cover with azithromycin for 5-day course.  Recommend continued supportive care at home.  Adding Hydromet cough syrup as needed.  Contact clinic if having new or worsening symptoms.   Meds ordered this encounter  Medications   azithromycin (ZITHROMAX) 250 MG tablet    Sig: Take 2 tablets on day 1, then 1 tablet daily on days 2 through 5    Dispense:  6 tablet    Refill:  0   HYDROcodone bit-homatropine (HYCODAN) 5-1.5 MG/5ML syrup    Sig: Take 5 mLs by mouth every 8 (eight) hours as needed for cough.    Dispense:  120 mL    Refill:  0    No follow-ups on file.    This visit occurred during the SARS-CoV-2 public health emergency.  Safety protocols were in place, including screening questions prior to the visit, additional usage of staff PPE, and extensive cleaning of exam room while observing appropriate contact time as indicated for disinfecting solutions.

## 2021-09-18 NOTE — Patient Instructions (Signed)
Start azithromycin as directed.  Continue supportive care.  Delsym as needed.  May use rx cough syrup if needed.

## 2021-09-18 NOTE — Assessment & Plan Note (Addendum)
Symptoms and exam consistent with atypical and/or viral pneumonia.  COVID swab collected today.  Will cover with azithromycin for 5-day course.  Recommend continued supportive care at home.  Adding Hydromet cough syrup as needed.  Contact clinic if having new or worsening symptoms.

## 2021-09-19 LAB — SARS-COV-2, NAA 2 DAY TAT

## 2021-09-19 LAB — NOVEL CORONAVIRUS, NAA: SARS-CoV-2, NAA: NOT DETECTED

## 2021-09-25 ENCOUNTER — Other Ambulatory Visit: Payer: Self-pay | Admitting: Family Medicine

## 2021-09-25 ENCOUNTER — Other Ambulatory Visit: Payer: Self-pay

## 2021-09-25 ENCOUNTER — Ambulatory Visit (INDEPENDENT_AMBULATORY_CARE_PROVIDER_SITE_OTHER): Payer: Managed Care, Other (non HMO)

## 2021-09-25 ENCOUNTER — Telehealth: Payer: Self-pay | Admitting: Family Medicine

## 2021-09-25 ENCOUNTER — Other Ambulatory Visit (INDEPENDENT_AMBULATORY_CARE_PROVIDER_SITE_OTHER): Payer: Managed Care, Other (non HMO)

## 2021-09-25 DIAGNOSIS — R300 Dysuria: Secondary | ICD-10-CM | POA: Diagnosis not present

## 2021-09-25 DIAGNOSIS — R3915 Urgency of urination: Secondary | ICD-10-CM | POA: Diagnosis not present

## 2021-09-25 DIAGNOSIS — R059 Cough, unspecified: Secondary | ICD-10-CM

## 2021-09-25 DIAGNOSIS — R0989 Other specified symptoms and signs involving the circulatory and respiratory systems: Secondary | ICD-10-CM

## 2021-09-25 LAB — POCT URINALYSIS DIP (CLINITEK)
Bilirubin, UA: NEGATIVE
Glucose, UA: NEGATIVE mg/dL
Ketones, POC UA: NEGATIVE mg/dL
Leukocytes, UA: NEGATIVE
Nitrite, UA: NEGATIVE
POC PROTEIN,UA: NEGATIVE
Spec Grav, UA: 1.015 (ref 1.010–1.025)
Urobilinogen, UA: 0.2 E.U./dL
pH, UA: 6.5 (ref 5.0–8.0)

## 2021-09-25 MED ORDER — AMOXICILLIN-POT CLAVULANATE 875-125 MG PO TABS: 1.0000 | ORAL_TABLET | Freq: Two times a day (BID) | ORAL | 0 refills | Status: DC

## 2021-09-25 NOTE — Telephone Encounter (Signed)
Spoke to patient. She will come by the office for urine sample and complete CXR.

## 2021-09-25 NOTE — Telephone Encounter (Signed)
Have her drop off a urine and stop in for CXR.  I am going to add augmentin  as well.

## 2021-09-25 NOTE — Progress Notes (Signed)
Orders placed per telephone note.

## 2021-09-25 NOTE — Telephone Encounter (Signed)
Pt called in stating that she was diagnosed on 02/07 with pneumonia and has taken z-pac for 5 days. She isn't any better and thinks she might have a UTI.

## 2021-09-26 ENCOUNTER — Ambulatory Visit: Payer: Managed Care, Other (non HMO) | Admitting: Family Medicine

## 2021-09-26 ENCOUNTER — Ambulatory Visit (INDEPENDENT_AMBULATORY_CARE_PROVIDER_SITE_OTHER): Payer: Managed Care, Other (non HMO)

## 2021-09-26 ENCOUNTER — Encounter: Payer: Self-pay | Admitting: Family Medicine

## 2021-09-26 VITALS — BP 124/86 | HR 116 | Ht 65.0 in | Wt 168.0 lb

## 2021-09-26 DIAGNOSIS — R1031 Right lower quadrant pain: Secondary | ICD-10-CM | POA: Diagnosis not present

## 2021-09-26 DIAGNOSIS — R109 Unspecified abdominal pain: Secondary | ICD-10-CM

## 2021-09-26 DIAGNOSIS — N201 Calculus of ureter: Secondary | ICD-10-CM

## 2021-09-26 DIAGNOSIS — R3129 Other microscopic hematuria: Secondary | ICD-10-CM | POA: Diagnosis not present

## 2021-09-26 MED ORDER — TAMSULOSIN HCL 0.4 MG PO CAPS: 0.4000 mg | ORAL_CAPSULE | Freq: Every day | ORAL | 0 refills | Status: DC

## 2021-09-26 MED ORDER — TRAMADOL HCL 50 MG PO TABS: 50.0000 mg | ORAL_TABLET | Freq: Three times a day (TID) | ORAL | 0 refills | Status: AC | PRN

## 2021-09-26 NOTE — Progress Notes (Signed)
Debbie Gallegos - 36 y.o. female MRN [id number]  Date of birth: 1986-02-26  Subjective Chief Complaint  Patient presents with   Flank Pain    HPI Debbie Gallegos is a 36 year old female here today with complaint of right flank pain, urinary urgency and frequency and some burning with urination.  She did stop by yesterday for urinalysis which showed some blood in her urine.  Flank pain evolved into worsening pain throughout the evening yesterday.  It has improved some today.  She denies associated nausea, fever or chills.  ROS:  A comprehensive ROS was completed and negative except as noted per HPI  Allergies  Allergen Reactions   Phenergan [Promethazine Hcl] Other (See Comments)    Lock jaw, neck stiffness    Past Medical History:  Diagnosis Date   Family history of breast cancer    Family history of kidney cancer    Family history of melanoma    Family history of prostate cancer    PCOS (polycystic ovarian syndrome)     Past Surgical History:  Procedure Laterality Date   CESAREAN SECTION     CHOLECYSTECTOMY, LAPAROSCOPIC     TUBAL LIGATION      Social History   Socioeconomic History   Marital status: Married    Spouse name: Emerald Shor   Number of children: 2   Years of education: Not on file   Highest education level: High school graduate  Occupational History   Occupation: Stay At Home Mother  Tobacco Use   Smoking status: Former    Types: Cigarettes    Quit date: 10/11/2015    Years since quitting: 5.9   Smokeless tobacco: Never  Vaping Use   Vaping Use: Never used  Substance and Sexual Activity   Alcohol use: Yes    Alcohol/week: 1.0 - 2.0 standard drink    Types: 1 - 2 Standard drinks or equivalent per week   Drug use: Never   Sexual activity: Yes    Partners: Male    Birth control/protection: None    Comment: Tubal Ligation  Other Topics Concern   Not on file  Social History Narrative   Not on file   Social Determinants of Health   Financial  Resource Strain: Not on file  Food Insecurity: Not on file  Transportation Needs: Not on file  Physical Activity: Not on file  Stress: Not on file  Social Connections: Not on file    Family History  Problem Relation Age of Onset   Hypertension Mother    Diabetes Mother    Stroke Mother    Breast cancer Mother 29       HER2+   Melanoma Father        ? due to agent orange   Other Father        cancerous nasal polyps   Heart disease Maternal Grandmother    Kidney cancer Maternal Grandfather 83       encapsulated   Breast cancer Maternal Aunt 54   Prostate cancer Paternal Grandfather    Uterine cancer Other 34       MGF's mother    Health Maintenance  Topic Date Due   HIV Screening  Never done   Hepatitis C Screening  Never done   COVID-19 Vaccine (3 - Booster for Merom series) 07/05/2020   INFLUENZA VACCINE  11/09/2021 (Originally 03/12/2021)   TETANUS/TDAP  08/12/2025   PAP SMEAR-Modifier  08/31/2026   HPV VACCINES  Aged Out     -----------------------------------------------------------------------------------------------------------------------------------------------------------------------------------------------------------------  Physical Exam BP 124/86 (BP Location: Left Arm, Patient Position: Sitting, Cuff Size: Large)    Pulse (!) 116    Ht 5' 5"  (1.651 m)    Wt 168 lb (76.2 kg)    LMP 09/11/2021 (Approximate)    SpO2 97%    BMI 27.96 kg/m   Physical Exam Constitutional:      Appearance: Normal appearance.  Eyes:     General: No scleral icterus. Cardiovascular:     Rate and Rhythm: Normal rate and regular rhythm.  Pulmonary:     Effort: Pulmonary effort is normal.     Breath sounds: Normal breath sounds.  Abdominal:     Palpations: Abdomen is soft.     Comments: Right flank tenderness to percussion.  Musculoskeletal:     Cervical back: Neck supple.  Neurological:     Mental Status: She is alert.  Psychiatric:        Mood and Affect: Mood normal.         Behavior: Behavior normal.    ------------------------------------------------------------------------------------------------------------------------------------------------------------------------------------------------------------------- Assessment and Plan  Ureteral stone Stat CT scan ordered showing 4 mm distal right ureteral stone without hydronephrosis.  Adding Flomax daily until stone passage.  Tramadol as needed for pain.   Meds ordered this encounter  Medications   tamsulosin (FLOMAX) 0.4 MG CAPS capsule    Sig: Take 1 capsule (0.4 mg total) by mouth daily.    Dispense:  30 capsule    Refill:  0   traMADol (ULTRAM) 50 MG tablet    Sig: Take 1-2 tablets (50-100 mg total) by mouth every 8 (eight) hours as needed for up to 5 days for moderate pain or severe pain.    Dispense:  20 tablet    Refill:  0    No follow-ups on file.    This visit occurred during the SARS-CoV-2 public health emergency.  Safety protocols were in place, including screening questions prior to the visit, additional usage of staff PPE, and extensive cleaning of exam room while observing appropriate contact time as indicated for disinfecting solutions.

## 2021-09-26 NOTE — Assessment & Plan Note (Signed)
Stat CT scan ordered showing 4 mm distal right ureteral stone without hydronephrosis.  Adding Flomax daily until stone passage.  Tramadol as needed for pain.

## 2021-09-27 LAB — URINE CULTURE
MICRO NUMBER:: 13009173
SPECIMEN QUALITY:: ADEQUATE

## 2021-10-03 ENCOUNTER — Encounter: Payer: Self-pay | Admitting: Family Medicine

## 2021-10-03 ENCOUNTER — Other Ambulatory Visit: Payer: Self-pay | Admitting: Family Medicine

## 2021-10-03 MED ORDER — KETOROLAC TROMETHAMINE 10 MG PO TABS: ORAL_TABLET | ORAL | 0 refills | Status: DC

## 2021-10-21 ENCOUNTER — Other Ambulatory Visit: Payer: Self-pay | Admitting: Family Medicine

## 2021-10-23 ENCOUNTER — Other Ambulatory Visit: Payer: Self-pay

## 2021-10-23 ENCOUNTER — Emergency Department
Admission: EM | Admit: 2021-10-23 | Discharge: 2021-10-23 | Disposition: A | Discharge status: Home / Self Care | Payer: Managed Care, Other (non HMO) | Source: Home / Self Care | Attending: Family Medicine | Admitting: Family Medicine

## 2021-10-23 DIAGNOSIS — J029 Acute pharyngitis, unspecified: Secondary | ICD-10-CM

## 2021-10-23 LAB — POCT RAPID STREP A (OFFICE): Rapid Strep A Screen: NEGATIVE

## 2021-10-23 NOTE — ED Triage Notes (Signed)
Pt presents with sore throat, bilat ear pain, and tightness in her chest that began over the weekend. ?

## 2021-10-23 NOTE — ED Provider Notes (Signed)
?[facility] ? ? ? ?CSN: [id number] ?Arrival date & time: 10/23/21  0803 ? ? ?  ? ?History   ?Chief Complaint ?Chief Complaint  ?Patient presents with  ? Sore Throat  ? ? ?HPI ?Debbie Gallegos is a 36 y.o. female.  ? ?HPI ? ?Patient is here for sore throat it has been present since LAST WEEK.  NO RUNNY NOSE OR COUGH.  NO HEADACHE OR BODY ACHES.  YOUNG DAUGHTER JUST Had a TONSILLECTOMY AND SHE IS CONCERNED FOR STREP. ? ?Past Medical History:  ?Diagnosis Date  ? Family history of breast cancer   ? Family history of kidney cancer   ? Family history of melanoma   ? Family history of prostate cancer   ? PCOS (polycystic ovarian syndrome)   ? ? ?Patient Active Problem List  ? Diagnosis Date Noted  ? Ureteral stone 09/26/2021  ? Atypical pneumonia 09/18/2021  ? Well adult exam 09/02/2021  ? Abnormal weight gain 05/31/2021  ? Breast pain, left 05/31/2021  ? Knee pain 01/29/2021  ? Family history of breast cancer   ? Family history of kidney cancer   ? Family history of prostate cancer   ? Family history of melanoma   ? PCOS (polycystic ovarian syndrome) 05/17/2020  ? Screening for lipid disorders 05/17/2020  ? Family history of breast cancer in first degree relative 05/17/2020  ? ? ?Past Surgical History:  ?Procedure Laterality Date  ? CESAREAN SECTION    ? CHOLECYSTECTOMY, LAPAROSCOPIC    ? TUBAL LIGATION    ? ? ?OB History   ?No obstetric history on file. ?  ? ? ? ?Home Medications   ? ?Prior to Admission medications   ?Medication Sig Start Date End Date Taking? Authorizing Provider  ?metFORMIN (GLUCOPHAGE) 500 MG tablet TAKE 2 TABLETS BY MOUTH IN THE MORNING AND 1 IN THE EVENING FOR  PCOS 10/22/21   Luetta Nutting, DO  ?phentermine 15 MG capsule Take 1 capsule (15 mg total) by mouth every morning. 08/31/21   Luetta Nutting, DO  ? ? ?Family History ?Family History  ?Problem Relation Age of Onset  ? Hypertension Mother   ? Diabetes Mother   ? Stroke Mother   ? Breast cancer Mother 3  ?     HER2+  ? Melanoma Father    ?     ? due to agent orange  ? Other Father   ?     cancerous nasal polyps  ? Heart disease Maternal Grandmother   ? Kidney cancer Maternal Grandfather 14  ?     encapsulated  ? Breast cancer Maternal Aunt 42  ? Prostate cancer Paternal Grandfather   ? Uterine cancer Other 50  ?     MGF's mother  ? ? ?Social History ?Social History  ? ?Tobacco Use  ? Smoking status: Former  ?  Types: Cigarettes  ?  Quit date: 10/11/2015  ?  Years since quitting: 6.0  ? Smokeless tobacco: Never  ?Vaping Use  ? Vaping Use: Never used  ?Substance Use Topics  ? Alcohol use: Yes  ?  Alcohol/week: 1.0 - 2.0 standard drink  ?  Types: 1 - 2 Standard drinks or equivalent per week  ? Drug use: Never  ? ? ? ?Allergies   ?Phenergan [promethazine hcl] ? ? ?Review of Systems ?Review of Systems ?See HPI ? ?Physical Exam ?Triage Vital Signs ?ED Triage Vitals  ?Enc Vitals Group  ?   BP 10/23/21 0813 117/83  ?   Pulse  Rate 10/23/21 0813 77  ?   Resp 10/23/21 0813 14  ?   Temp 10/23/21 0813 97.8 ?F (36.6 ?C)  ?   Temp Source 10/23/21 0813 Oral  ?   SpO2 10/23/21 0813 99 %  ?   Weight 10/23/21 0815 162 lb (73.5 kg)  ?   Height --   ?   Head Circumference --   ?   Peak Flow --   ?   Pain Score 10/23/21 0815 0  ?   Pain Loc --   ?   Pain Edu? --   ?   Excl. in [place]? --   ? ?No data found. ? ?Updated Vital Signs ?BP 117/83 (BP Location: Left Arm)   Pulse 77   Temp 97.8 ?F (36.6 ?C) (Oral)   Resp 14   Wt 73.5 kg   SpO2 99%   BMI 26.96 kg/m?  ?    ? ?Physical Exam ?Constitutional:   ?   General: She is not in acute distress. ?   Appearance: She is well-developed.  ?HENT:  ?   Head: Normocephalic and atraumatic.  ?   Right Ear: Tympanic membrane and ear canal normal.  ?   Left Ear: Tympanic membrane and ear canal normal.  ?   Nose: No congestion or rhinorrhea.  ?   Mouth/Throat:  ?   Mouth: Mucous membranes are moist. No oral lesions.  ?   Pharynx: Uvula midline. Posterior oropharyngeal erythema present. No pharyngeal swelling or oropharyngeal exudate.  ?    Tonsils: No tonsillar exudate.  ?Eyes:  ?   Conjunctiva/sclera: Conjunctivae normal.  ?   Pupils: Pupils are equal, round, and reactive to light.  ?Cardiovascular:  ?   Rate and Rhythm: Normal rate.  ?Pulmonary:  ?   Effort: Pulmonary effort is normal. No respiratory distress.  ?Abdominal:  ?   General: There is no distension.  ?   Palpations: Abdomen is soft.  ?Musculoskeletal:     ?   General: Normal range of motion.  ?   Cervical back: Normal range of motion.  ?Lymphadenopathy:  ?   Cervical: No cervical adenopathy.  ?Skin: ?   General: Skin is warm and dry.  ?Neurological:  ?   Mental Status: She is alert.  ? ? ? ?UC Treatments / Results  ?Labs ?(all labs ordered are listed, but only abnormal results are displayed) ?Labs Reviewed  ?CULTURE, GROUP A STREP  ?POCT RAPID STREP A (OFFICE)  ? ? ?EKG ? ? ?Radiology ?No results found. ? ?Procedures ?Procedures (including critical care time) ? ?Medications Ordered in UC ?Medications - No data to display ? ?Initial Impression / Assessment and Plan / UC Course  ?I have reviewed the triage vital signs and the nursing notes. ? ?Pertinent labs & imaging results that were available during my care of the patient were reviewed by me and considered in my medical decision making (see chart for details). ? ?  ? ?Discussed that rapid strep is negative. ?Final Clinical Impressions(s) / UC Diagnoses  ? ?Final diagnoses:  ?Acute pharyngitis, unspecified etiology  ? ? ? ?Discharge Instructions   ? ?  ?Your throat culture report will be available in 2 to 3 days on MyChart ?Call for problems ? ? ?ED Prescriptions   ?None ?  ? ?PDMP not reviewed this encounter. ?  ?Raylene Everts, MD ?10/23/21 [phone] ? ?

## 2021-10-23 NOTE — Discharge Instructions (Addendum)
Your throat culture report will be available in 2 to 3 days on MyChart ?Call for problems ?

## 2021-10-27 LAB — CULTURE, GROUP A STREP

## 2022-02-13 ENCOUNTER — Encounter: Payer: Self-pay | Admitting: Family Medicine

## 2022-02-15 ENCOUNTER — Encounter: Payer: Self-pay | Admitting: Emergency Medicine

## 2022-02-15 ENCOUNTER — Emergency Department: Admit: 2022-02-15 | Discharge status: Absent without leave | Payer: Self-pay

## 2022-02-15 ENCOUNTER — Emergency Department
Admission: EM | Admit: 2022-02-15 | Discharge: 2022-02-15 | Disposition: A | Discharge status: Home / Self Care | Payer: Managed Care, Other (non HMO) | Source: Home / Self Care

## 2022-02-15 DIAGNOSIS — U071 COVID-19: Secondary | ICD-10-CM | POA: Diagnosis not present

## 2022-02-15 LAB — POC SARS CORONAVIRUS 2 AG -  ED: SARS Coronavirus 2 Ag: POSITIVE — AB

## 2022-02-15 NOTE — ED Triage Notes (Addendum)
Chills, sore throat since 02/16/22 Known COVID exposure  Mom is positive for COVID  OTC nyquil last night  Pt would like antiviral medication Temp 100 at home COVID vaccine x 2 - no boosters

## 2022-02-15 NOTE — Discharge Instructions (Signed)
Take medication as directed with food to completion. Stop Dayquil and Nyquil. Try pseudoephedrine instead. Take Mucinex 1200mg  twice daily with full glass of water. Isolate for 5 days. May wear mask for additional 5 days while in public. If symptoms worsen or getting shortness of breath, proceed to closest Emergency Dept.

## 2022-02-17 ENCOUNTER — Other Ambulatory Visit: Payer: Self-pay | Admitting: Family Medicine

## 2022-02-17 NOTE — ED Provider Notes (Signed)
[name]    CSN: [id number] Arrival date & time: 02/15/22  1054      History   Chief Complaint Chief Complaint  Patient presents with   Sore Throat    HPI Debbie Gallegos is a 36 y.o. female.   Three days ago patient developed fatigue, anterior chest pressure without cough, sore throat, myalgias, headache, sinus congestion, and nausea without vomiting.  Her mom has had a positive COVID19 test.  She denies changes in taste/smell. She states that she had a mild COVID19 infection January 2022.  She had pneumonia during the past winter.  The history is provided by the patient.    Past Medical History:  Diagnosis Date   Family history of breast cancer    Family history of kidney cancer    Family history of melanoma    Family history of prostate cancer    PCOS (polycystic ovarian syndrome)     Patient Active Problem List   Diagnosis Date Noted   Ureteral stone 09/26/2021   Atypical pneumonia 09/18/2021   Well adult exam 09/02/2021   Abnormal weight gain 05/31/2021   Breast pain, left 05/31/2021   Knee pain 01/29/2021   Family history of breast cancer    Family history of kidney cancer    Family history of prostate cancer    Family history of melanoma    PCOS (polycystic ovarian syndrome) 05/17/2020   Screening for lipid disorders 05/17/2020   Family history of breast cancer in first degree relative 05/17/2020    Past Surgical History:  Procedure Laterality Date   CESAREAN SECTION     CHOLECYSTECTOMY, LAPAROSCOPIC     TUBAL LIGATION      OB History   No obstetric history on file.      Home Medications    Prior to Admission medications   Medication Sig Start Date End Date Taking? Authorizing Provider  metFORMIN (GLUCOPHAGE) 500 MG tablet TAKE 2 TABLETS BY MOUTH IN THE MORNING AND 1 IN THE EVENING FOR  PCOS 10/22/21   Luetta Nutting, DO  phentermine 15 MG capsule Take 1 capsule (15 mg total) by mouth every morning. 08/31/21   Luetta Nutting, DO     Family History Family History  Problem Relation Age of Onset   Hypertension Mother    Diabetes Mother    Stroke Mother    Breast cancer Mother 53       HER2+   Melanoma Father        ? due to agent orange   Other Father        cancerous nasal polyps   Heart disease Maternal Grandmother    Kidney cancer Maternal Grandfather 83       encapsulated   Breast cancer Maternal Aunt 54   Prostate cancer Paternal Grandfather    Uterine cancer Other 59       MGF's mother    Social History Social History   Tobacco Use   Smoking status: Former    Types: Cigarettes    Quit date: 10/11/2015    Years since quitting: 6.3   Smokeless tobacco: Never  Vaping Use   Vaping Use: Never used  Substance Use Topics   Alcohol use: Not Currently   Drug use: Never     Allergies   Phenergan [promethazine hcl]   Review of Systems Review of Systems + sore throat No cough No pleuritic pain No wheezing + nasal congestion + post-nasal drainage No sinus pain/pressure No itchy/red eyes No earache  No hemoptysis No SOB + fever + nausea No vomiting No abdominal pain No diarrhea No urinary symptoms No skin rash + fatigue + myalgias + headache Used OTC meds without relief   Physical Exam Triage Vital Signs ED Triage Vitals  Enc Vitals Group     BP 02/15/22 1110 127/84     Pulse Rate 02/15/22 1110 (!) 119     Resp 02/15/22 1110 17     Temp 02/15/22 1110 100.3 F (37.9 C)     Temp Source 02/15/22 1110 Oral     SpO2 02/15/22 1110 97 %     Weight 02/15/22 1111 166 lb (75.3 kg)     Height 02/15/22 1111 5' 5"  (1.651 m)     Head Circumference --      Peak Flow --      Pain Score 02/15/22 1111 4     Pain Loc --      Pain Edu? --      Excl. in [place]? --    No data found.  Updated Vital Signs BP 127/84 (BP Location: Left Arm)   Pulse (!) 119   Temp 100.3 F (37.9 C) (Oral)   Resp 17   Ht 5' 5"  (1.651 m)   Wt 75.3 kg   LMP 01/24/2022 (Approximate) Comment: PCOS  SpO2 97%    BMI 27.62 kg/m   Visual Acuity Right Eye Distance:   Left Eye Distance:   Bilateral Distance:    Right Eye Near:   Left Eye Near:    Bilateral Near:     Physical Exam Nursing notes and Vital Signs reviewed. Appearance:  Patient appears stated age, and in no acute distress Eyes:  Pupils are equal, round, and reactive to light and accomodation.  Extraocular movement is intact.  Conjunctivae are not inflamed  Ears:  Canals normal.  Tympanic membranes normal.  Nose:  Mildly congested turbinates.  No sinus tenderness.   Neck:  Supple.  Mildly enlarged lateral nodes are present, tender to palpation on the left.   Lungs:  Clear to auscultation.  Breath sounds are equal.  Moving air well. Heart:  Regular rate and rhythm without murmurs, rubs, or gallops.  Abdomen:  Nontender without masses or hepatosplenomegaly.  Bowel sounds are present.  No CVA or flank tenderness.  Extremities:  No edema.  Skin:  No rash present.   UC Treatments / Results  Labs (all labs ordered are listed, but only abnormal results are displayed) Labs Reviewed  POC SARS CORONAVIRUS 2 AG -  ED - Abnormal; Notable for the following components:      Result Value   SARS Coronavirus 2 Ag Positive (*)    All other components within normal limits    EKG   Radiology No results found.  Procedures Procedures (including critical care time)  Medications Ordered in UC Medications - No data to display  Initial Impression / Assessment and Plan / UC Course  I have reviewed the triage vital signs and the nursing notes.  Pertinent labs & imaging results that were available during my care of the patient were reviewed by me and considered in my medical decision making (see chart for details).    Rx written for Paxlovid 38m/100mg convenience pack. Followup with Family Doctor if not improved in about 5 days.  Final Clinical Impressions(s) / UC Diagnoses   Final diagnoses:  [id number] by clinical  diagnostic criteria     Discharge Instructions      Take medication as directed  with food to completion. Stop Dayquil and Nyquil. Try pseudoephedrine instead. Take Mucinex 1225m twice daily with full glass of water. Isolate for 5 days. May wear mask for additional 5 days while in public. If symptoms worsen or getting shortness of breath, proceed to closest Emergency Dept.    ED Prescriptions   None       BKandra Nicolas MD 02/17/22 2138

## 2022-02-18 NOTE — Telephone Encounter (Signed)
Pls schedule pt for weight loss appt. Sending 60 day med refill.

## 2022-02-19 NOTE — Telephone Encounter (Signed)
Patient has been scheduled for 03/13/22. AMUCK

## 2022-03-13 ENCOUNTER — Ambulatory Visit: Payer: Managed Care, Other (non HMO) | Admitting: Family Medicine

## 2022-03-18 ENCOUNTER — Encounter: Payer: Self-pay | Admitting: Family Medicine

## 2022-03-18 ENCOUNTER — Ambulatory Visit: Payer: Managed Care, Other (non HMO) | Admitting: Family Medicine

## 2022-03-18 DIAGNOSIS — E282 Polycystic ovarian syndrome: Secondary | ICD-10-CM | POA: Diagnosis not present

## 2022-03-18 NOTE — Progress Notes (Signed)
Debbie Gallegos - 36 y.o. female MRN [id number]  Date of birth: 11-28-1985  Subjective No chief complaint on file.   HPI Debbie Gallegos is a 37 y.o. female here today for a follow-up.  She reports she is doing well.  Had COVID earlier this month but denies any continued symptoms related to this.  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  Doing well with this and denies side effects.  Additionally she is taking phentermine 15 mg daily.  She feels like this continues to work pretty well for appetite suppression.  She has not noted any side effects from this either.  ROS:  A comprehensive ROS was completed and negative except as noted per HPI  Allergies  Allergen Reactions   Phenergan [Promethazine Hcl] Other (See Comments)    Lock jaw, neck stiffness    Past Medical History:  Diagnosis Date   Family history of breast cancer    Family history of kidney cancer    Family history of melanoma    Family history of prostate cancer    PCOS (polycystic ovarian syndrome)     Past Surgical History:  Procedure Laterality Date   CESAREAN SECTION     CHOLECYSTECTOMY, LAPAROSCOPIC     TUBAL LIGATION      Social History   Socioeconomic History   Marital status: Married    Spouse name: Jazman Reuter   Number of children: 2   Years of education: Not on file   Highest education level: High school graduate  Occupational History   Occupation: Stay At Home Mother  Tobacco Use   Smoking status: Former    Types: Cigarettes    Quit date: 10/11/2015    Years since quitting: 6.4   Smokeless tobacco: Never  Vaping Use   Vaping Use: Never used  Substance and Sexual Activity   Alcohol use: Not Currently   Drug use: Never   Sexual activity: Yes    Partners: Male    Birth control/protection: None    Comment: Tubal Ligation  Other Topics Concern   Not on file  Social History Narrative   Not on file   Social Determinants of Health   Financial Resource Strain: Not on  file  Food Insecurity: Not on file  Transportation Needs: Not on file  Physical Activity: Not on file  Stress: Not on file  Social Connections: Not on file    Family History  Problem Relation Age of Onset   Hypertension Mother    Diabetes Mother    Stroke Mother    Breast cancer Mother 41       HER2+   Melanoma Father        ? due to agent orange   Other Father        cancerous nasal polyps   Heart disease Maternal Grandmother    Kidney cancer Maternal Grandfather 83       encapsulated   Breast cancer Maternal Aunt 54   Prostate cancer Paternal Grandfather    Uterine cancer Other 32       MGF's mother    Health Maintenance  Topic Date Due   HIV Screening  Never done   Hepatitis C Screening  Never done   COVID-19 Vaccine (3 - Pfizer series) 07/05/2020   INFLUENZA VACCINE  03/12/2022   TETANUS/TDAP  08/12/2025   PAP SMEAR-Modifier  08/31/2026   HPV VACCINES  Aged Out     ----------------------------------------------------------------------------------------------------------------------------------------------------------------------------------------------------------------- Physical Exam BP 118/81 (BP Location: Right Arm, Patient  Position: Sitting, Cuff Size: Normal)   Pulse 82   Ht 5' 5"  (1.651 m)   Wt 169 lb (76.7 kg)   SpO2 97%   BMI 28.12 kg/m   Physical Exam Constitutional:      Appearance: Normal appearance.  Eyes:     General: No scleral icterus. Cardiovascular:     Rate and Rhythm: Normal rate and regular rhythm.  Pulmonary:     Effort: Pulmonary effort is normal.     Breath sounds: Normal breath sounds.  Musculoskeletal:     Cervical back: Neck supple.  Neurological:     Mental Status: She is alert.  Psychiatric:        Mood and Affect: Mood normal.        Behavior: Behavior normal.      ------------------------------------------------------------------------------------------------------------------------------------------------------------------------------------------------------------------- Assessment and Plan  PCOS (polycystic ovarian syndrome) Continues on metformin for management of insulin resistance related to PCOS.  She will also continue on phentermine at current strength to help with appetite suppression and weight management.   No orders of the defined types were placed in this encounter.   Return in about 6 months (around 09/18/2022) for F/u Metformin/Phentermine.    This visit occurred during the SARS-CoV-2 public health emergency.  Safety protocols were in place, including screening questions prior to the visit, additional usage of staff PPE, and extensive cleaning of exam room while observing appropriate contact time as indicated for disinfecting solutions.

## 2022-03-18 NOTE — Assessment & Plan Note (Signed)
Continues on metformin for management of insulin resistance related to PCOS.  She will also continue on phentermine at current strength to help with appetite suppression and weight management.

## 2022-04-21 ENCOUNTER — Other Ambulatory Visit: Payer: Self-pay | Admitting: Family Medicine

## 2022-04-25 ENCOUNTER — Other Ambulatory Visit: Payer: Self-pay

## 2022-04-25 DIAGNOSIS — R635 Abnormal weight gain: Secondary | ICD-10-CM

## 2022-04-25 MED ORDER — PHENTERMINE HCL 15 MG PO CAPS: 15.0000 mg | ORAL_CAPSULE | Freq: Every morning | ORAL | 0 refills | Status: DC

## 2022-05-29 ENCOUNTER — Other Ambulatory Visit: Payer: Self-pay | Admitting: Family Medicine

## 2022-05-29 DIAGNOSIS — R635 Abnormal weight gain: Secondary | ICD-10-CM

## 2022-05-29 NOTE — Telephone Encounter (Signed)
Last visit 03/18/2022 Return in about 6 months (around 09/18/2022) for F/u Metformin/Phentermine. In this note Next appt 09/28/2022

## 2022-06-05 ENCOUNTER — Encounter: Payer: Self-pay | Admitting: Family Medicine

## 2022-06-05 ENCOUNTER — Other Ambulatory Visit: Payer: Self-pay | Admitting: Family Medicine

## 2022-06-05 DIAGNOSIS — R635 Abnormal weight gain: Secondary | ICD-10-CM

## 2022-07-10 ENCOUNTER — Encounter: Payer: Self-pay | Admitting: Family Medicine

## 2022-07-10 ENCOUNTER — Ambulatory Visit
Admission: EM | Admit: 2022-07-10 | Discharge: 2022-07-10 | Disposition: A | Discharge status: Home / Self Care | Payer: Managed Care, Other (non HMO) | Attending: Family Medicine | Admitting: Family Medicine

## 2022-07-10 ENCOUNTER — Ambulatory Visit: Admit: 2022-07-10 | Discharge status: Absent without leave | Payer: Managed Care, Other (non HMO)

## 2022-07-10 DIAGNOSIS — J029 Acute pharyngitis, unspecified: Secondary | ICD-10-CM | POA: Insufficient documentation

## 2022-07-10 LAB — POCT RAPID STREP A (OFFICE): Rapid Strep A Screen: NEGATIVE

## 2022-07-10 NOTE — Discharge Instructions (Signed)
Drink lots of water Take OTC medicine as needed The culture will be available in a couple of days on My Chart You will be called if culture is positive.

## 2022-07-10 NOTE — ED Triage Notes (Addendum)
Pt c/o RT ear pain and sore throat since Sunday. Taking ibuprofen prn. Tested pos for Strep B on tcx in March. Had COVID in July.

## 2022-07-10 NOTE — ED Provider Notes (Signed)
[facility]    CSN: [id number] Arrival date & time: 07/10/22  6761      History   Chief Complaint Chief Complaint  Patient presents with   Otalgia    RT   Sore Throat    HPI Debbie Gallegos is a 36 y.o. female.   HPI  Patient is here for sore throat and ear pain.  Started yesterday.  Has a history of strep throat.  Is worried because it is a painful sore throat.  No runny nose or cough.  She has a kindergartner at home.  No one else is sick.  Denies fever  Past Medical History:  Diagnosis Date   Family history of breast cancer    Family history of kidney cancer    Family history of melanoma    Family history of prostate cancer    PCOS (polycystic ovarian syndrome)     Patient Active Problem List   Diagnosis Date Noted   Ureteral stone 09/26/2021   Well adult exam 09/02/2021   Abnormal weight gain 05/31/2021   Breast pain, left 05/31/2021   Knee pain 01/29/2021   Family history of breast cancer    Family history of kidney cancer    Family history of prostate cancer    Family history of melanoma    PCOS (polycystic ovarian syndrome) 05/17/2020   Screening for lipid disorders 05/17/2020   Family history of breast cancer in first degree relative 05/17/2020    Past Surgical History:  Procedure Laterality Date   CESAREAN SECTION     CHOLECYSTECTOMY, LAPAROSCOPIC     TUBAL LIGATION      OB History   No obstetric history on file.      Home Medications    Prior to Admission medications   Medication Sig Start Date End Date Taking? Authorizing Provider  metFORMIN (GLUCOPHAGE) 500 MG tablet TAKE 2 TABLETS BY MOUTH IN THE MORNING AND 1 TABLET IN THE EVENING FOR PCOS. 04/22/22   Luetta Nutting, DO  phentermine 15 MG capsule Take 1 capsule by mouth in the morning 05/29/22   Luetta Nutting, DO    Family History Family History  Problem Relation Age of Onset   Hypertension Mother    Diabetes Mother    Stroke Mother    Breast cancer Mother 75        HER2+   Melanoma Father        ? due to agent orange   Other Father        cancerous nasal polyps   Heart disease Maternal Grandmother    Kidney cancer Maternal Grandfather 83       encapsulated   Breast cancer Maternal Aunt 54   Prostate cancer Paternal Grandfather    Uterine cancer Other 60       MGF's mother    Social History Social History   Tobacco Use   Smoking status: Former    Types: Cigarettes    Quit date: 10/11/2015    Years since quitting: 6.7   Smokeless tobacco: Never  Vaping Use   Vaping Use: Never used  Substance Use Topics   Alcohol use: Not Currently   Drug use: Never     Allergies   Phenergan [promethazine hcl]   Review of Systems Review of Systems See HPI  Physical Exam Triage Vital Signs ED Triage Vitals  Enc Vitals Group     BP 07/10/22 0835 125/84     Pulse Rate 07/10/22 0835 81  Resp 07/10/22 0835 18     Temp 07/10/22 0835 98.4 F (36.9 C)     Temp Source 07/10/22 0835 Oral     SpO2 07/10/22 0835 100 %     Weight --      Height --      Head Circumference --      Peak Flow --      Pain Score 07/10/22 0836 4     Pain Loc --      Pain Edu? --      Excl. in [place]? --    No data found.  Updated Vital Signs BP 125/84 (BP Location: Left Arm)   Pulse 81   Temp 98.4 F (36.9 C) (Oral)   Resp 18   LMP 06/05/2022 (Approximate)   SpO2 100%       Physical Exam Constitutional:      General: She is not in acute distress.    Appearance: She is well-developed.  HENT:     Head: Normocephalic and atraumatic.     Right Ear: Tympanic membrane normal. Tympanic membrane is not erythematous.     Left Ear: Tympanic membrane normal. Tympanic membrane is not erythematous.     Nose: No congestion or rhinorrhea.     Mouth/Throat:     Pharynx: Uvula midline. Pharyngeal swelling and posterior oropharyngeal erythema present.     Tonsils: No tonsillar exudate. 2+ on the right. 1+ on the left.  Eyes:     Conjunctiva/sclera: Conjunctivae normal.      Pupils: Pupils are equal, round, and reactive to light.  Cardiovascular:     Rate and Rhythm: Normal rate.  Pulmonary:     Effort: Pulmonary effort is normal. No respiratory distress.  Abdominal:     General: There is no distension.     Palpations: Abdomen is soft.  Musculoskeletal:        General: Normal range of motion.     Cervical back: Normal range of motion.  Lymphadenopathy:     Cervical: Cervical adenopathy present.  Skin:    General: Skin is warm and dry.  Neurological:     Mental Status: She is alert.      UC Treatments / Results  Labs (all labs ordered are listed, but only abnormal results are displayed) Labs Reviewed  CULTURE, GROUP A STREP [facility])  POCT RAPID STREP A (OFFICE)    EKG   Radiology No results found.  Procedures Procedures (including critical care time)  Medications Ordered in UC Medications - No data to display  Initial Impression / Assessment and Plan / UC Course  I have reviewed the triage vital signs and the nursing notes.  Pertinent labs & imaging results that were available during my care of the patient were reviewed by me and considered in my medical decision making (see chart for details).     Final Clinical Impressions(s) / UC Diagnoses   Final diagnoses:  Acute pharyngitis, unspecified etiology     Discharge Instructions      Drink lots of water Take OTC medicine as needed The culture will be available in a couple of days on My Chart You will be called if culture is positive.   ED Prescriptions   None    PDMP not reviewed this encounter.   Raylene Everts, MD 07/10/22 1013

## 2022-07-12 LAB — CULTURE, GROUP A STREP (THRC)

## 2022-07-15 ENCOUNTER — Ambulatory Visit: Payer: Managed Care, Other (non HMO) | Admitting: Family Medicine

## 2022-07-15 ENCOUNTER — Encounter: Payer: Self-pay | Admitting: Family Medicine

## 2022-07-15 ENCOUNTER — Other Ambulatory Visit: Payer: Self-pay | Admitting: Family Medicine

## 2022-07-15 DIAGNOSIS — R635 Abnormal weight gain: Secondary | ICD-10-CM

## 2022-07-16 ENCOUNTER — Encounter: Payer: Self-pay | Admitting: Family Medicine

## 2022-07-16 ENCOUNTER — Ambulatory Visit (INDEPENDENT_AMBULATORY_CARE_PROVIDER_SITE_OTHER): Payer: Managed Care, Other (non HMO) | Admitting: Family Medicine

## 2022-07-16 VITALS — BP 130/91 | HR 70 | Ht 65.0 in | Wt 172.0 lb

## 2022-07-16 DIAGNOSIS — H7292 Unspecified perforation of tympanic membrane, left ear: Secondary | ICD-10-CM | POA: Diagnosis not present

## 2022-07-16 DIAGNOSIS — H6692 Otitis media, unspecified, left ear: Secondary | ICD-10-CM | POA: Diagnosis not present

## 2022-07-16 MED ORDER — AMOXICILLIN-POT CLAVULANATE 875-125 MG PO TABS: 1.0000 | ORAL_TABLET | Freq: Two times a day (BID) | ORAL | 0 refills | Status: DC

## 2022-07-16 MED ORDER — OFLOXACIN 0.3 % OT SOLN: 10.0000 [drp] | Freq: Two times a day (BID) | OTIC | 0 refills | Status: AC

## 2022-07-16 NOTE — Progress Notes (Signed)
Debbie Gallegos - 36 y.o. female MRN [id number]  Date of birth: 04/30/1986  Subjective Chief Complaint  Patient presents with   Ear Pain   Sore Throat    HPI Debbie Gallegos is a 36 y.o. female here today with complaint of drainage from the left ear.  Seen in urgent care recently and diagnosed with sinus infection as well as bilateral otitis media.  Started on amoxicillin.  She has not noted any significant improvement since starting this.  Yesterday noted blood and drainage from the left ear as well as weaning and decreased hearing.  She also has pretty severe sore throat at this time.  She has not had fever or chills.  Denies any significant lower respiratory symptoms.  ROS:  A comprehensive ROS was completed and negative except as noted per HPI  Allergies  Allergen Reactions   Phenergan [Promethazine Hcl] Other (See Comments)    Lock jaw, neck stiffness    Past Medical History:  Diagnosis Date   Family history of breast cancer    Family history of kidney cancer    Family history of melanoma    Family history of prostate cancer    PCOS (polycystic ovarian syndrome)     Past Surgical History:  Procedure Laterality Date   CESAREAN SECTION     CHOLECYSTECTOMY, LAPAROSCOPIC     TUBAL LIGATION      Social History   Socioeconomic History   Marital status: Married    Spouse name: Suzi Hernan   Number of children: 2   Years of education: Not on file   Highest education level: High school graduate  Occupational History   Occupation: Stay At Home Mother  Tobacco Use   Smoking status: Former    Types: Cigarettes    Quit date: 10/11/2015    Years since quitting: 6.7   Smokeless tobacco: Never  Vaping Use   Vaping Use: Never used  Substance and Sexual Activity   Alcohol use: Not Currently   Drug use: Never   Sexual activity: Yes    Partners: Male    Birth control/protection: None    Comment: Tubal Ligation  Other Topics Concern   Not on file  Social History Narrative    Not on file   Social Determinants of Health   Financial Resource Strain: Not on file  Food Insecurity: Not on file  Transportation Needs: Not on file  Physical Activity: Not on file  Stress: Not on file  Social Connections: Not on file    Family History  Problem Relation Age of Onset   Hypertension Mother    Diabetes Mother    Stroke Mother    Breast cancer Mother 54       HER2+   Melanoma Father        ? due to agent orange   Other Father        cancerous nasal polyps   Heart disease Maternal Grandmother    Kidney cancer Maternal Grandfather 83       encapsulated   Breast cancer Maternal Aunt 54   Prostate cancer Paternal Grandfather    Uterine cancer Other 32       MGF's mother    Health Maintenance  Topic Date Due   COVID-19 Vaccine (3 - 2023-24 season) 09/12/2022 (Originally 04/12/2022)   INFLUENZA VACCINE  11/10/2022 (Originally 03/12/2022)   Hepatitis C Screening  07/17/2023 (Originally 08/16/2001)   HIV Screening  07/17/2023 (Originally 08/16/1998)   DTaP/Tdap/Td (2 - Td or Tdap) 08/12/2025  PAP SMEAR-Modifier  08/31/2026   HPV VACCINES  Aged Out     ----------------------------------------------------------------------------------------------------------------------------------------------------------------------------------------------------------------- Physical Exam BP (!) 130/91 (BP Location: Right Arm, Patient Position: Sitting, Cuff Size: Large)   Pulse 70   Ht _0  (1.651 m)   Wt 172 lb (78 kg)   LMP 06/05/2022 (Approximate)   SpO2 97%   BMI 28.62 kg/m   Physical Exam Constitutional:      Appearance: She is well-developed.  HENT:     Head: Normocephalic and atraumatic.     Ears:     Comments: Right TM has erythematous and bulging.  Left TM ruptured with dried blood in the canal.    Nose:     Comments: Maxillary sinus tenderness. Eyes:     General: No scleral icterus. Cardiovascular:     Rate and Rhythm: Normal rate and regular rhythm.   Pulmonary:     Effort: Pulmonary effort is normal.     Breath sounds: Normal breath sounds.  Musculoskeletal:     Cervical back: Neck supple.  Neurological:     Mental Status: She is alert.  Psychiatric:        Mood and Affect: Mood normal.        Behavior: Behavior normal.     ------------------------------------------------------------------------------------------------------------------------------------------------------------------------------------------------------------------- Assessment and Plan  Otitis media with rupture of tympanic membrane, left Bilateral otitis media with rupture of the left TM.  Adding ofloxacin drops.  Switch amoxicillin to Augmentin.  Return in 2 weeks for recheck of the ear.  Contact clinic if having new or worsening symptoms.   Meds ordered this encounter  Medications   amoxicillin-clavulanate (AUGMENTIN) 875-125 MG tablet    Sig: Take 1 tablet by mouth 2 (two) times daily.    Dispense:  20 tablet    Refill:  0   ofloxacin (FLOXIN OTIC) 0.3 % OTIC solution    Sig: Place 10 drops into the left ear 2 (two) times daily for 10 days.    Dispense:  10 mL    Refill:  0    No follow-ups on file.    This visit occurred during the SARS-CoV-2 public health emergency.  Safety protocols were in place, including screening questions prior to the visit, additional usage of staff PPE, and extensive cleaning of exam room while observing appropriate contact time as indicated for disinfecting solutions.

## 2022-07-16 NOTE — Patient Instructions (Signed)
Eardrum Rupture  An eardrum rupture is a hole (perforation) in the eardrum. The eardrum is a thin, round tissue inside the ear. It allows you to hear. This condition may cause pain and hearing loss. There is often little or no long-term hearing loss. What are the causes? This condition may be caused by: An infection. An injury from: Putting a thin object into the ear. Getting hit on the side of the head. Falling onto water or a flat surface. Changes in pressure that can happen from flying, scuba diving, or a very loud noise. Inserting a cotton swab in the ear. Long-term ear problems. Surgery on the ear. Getting a tube called a PE tube removed or having it fall out. This is a tube placed during a surgery to help with ear problems. What increases the risk? Having had PE tubes put in your ears. Having an ear infection. Playing sports that: Involve balls or contact with other players. Take place in water, such as diving, scuba diving, or waterskiing. What are the signs or symptoms? Pain. Ringing in the ear. Fluid leaking from the ear. Hearing loss. Dizziness. How is this treated? The eardrum often heals on its own in a few weeks. If your eardrum does not heal, your doctor may recommend surgery to fix the eardrum. You may also need antibiotic medicine to help prevent infection. Follow these instructions at home: Medicines Take over-the-counter and prescription medicines only as told by your doctor. If you were prescribed an antibiotic medicine, use it as told by your doctor. Do not stop using it even if you start to feel better. Caring for your ear Keep your ear dry. Follow instructions from your doctor about how to keep your ear dry. You may need to wear waterproof earplugs when bathing and swimming. If told, put heat on the affected ear to help with pain. Do this as often as told by your doctor. Use the heat source that your doctor recommends, such as a moist heat pack or a heating  pad. Place a towel between your skin and the heat source. Leave the heat on for 20-30 minutes. Take off the heat if your skin turns bright red. This is very important. If you cannot feel pain, heat, or cold, you have a greater risk of getting burned. General instructions Return to your normal activities when your doctor says that it is safe. When you play sports in which ear injuries may happen, wear headgear with ear protection. Talk to your doctor before you fly on an airplane. Keep all follow-up visits. Contact a doctor if: You have a fever. You have ear pain. You have mucus or blood leaking from your ear. You cannot hear. You have ringing in the ear. You feel dizzy. Get help right away if: You have sudden hearing loss. You are very dizzy. You get very bad pain in your ear. You have weakness in your face. You cannot move parts of your face. These symptoms may be an emergency. Do not wait to see if the symptoms will go away. Get help right away. Call your local emergency services (911 in the [place] Summary An eardrum rupture is a tear that makes a hole in the eardrum. The eardrum will likely heal on its own within a few weeks. Follow instructions from your doctor about how to keep your ear dry and protected as it heals. This information is not intended to replace advice given to you by your health care provider. Make sure you discuss any  questions you have with your health care provider. Document Revised: 06/19/2020 Document Reviewed: 06/19/2020 Elsevier Patient Education  2023 [employer] Inc.  

## 2022-07-16 NOTE — Assessment & Plan Note (Signed)
Bilateral otitis media with rupture of the left TM.  Adding ofloxacin drops.  Switch amoxicillin to Augmentin.  Return in 2 weeks for recheck of the ear.  Contact clinic if having new or worsening symptoms.

## 2022-07-22 ENCOUNTER — Other Ambulatory Visit: Payer: Self-pay | Admitting: Family Medicine

## 2022-07-22 DIAGNOSIS — R635 Abnormal weight gain: Secondary | ICD-10-CM

## 2022-07-22 MED ORDER — OSELTAMIVIR PHOSPHATE 75 MG PO CAPS: 75.0000 mg | ORAL_CAPSULE | Freq: Every day | ORAL | 0 refills | Status: AC

## 2022-07-22 MED ORDER — PHENTERMINE HCL 15 MG PO CAPS: 15.0000 mg | ORAL_CAPSULE | Freq: Every morning | ORAL | 0 refills | Status: DC

## 2022-07-30 ENCOUNTER — Encounter: Payer: Self-pay | Admitting: Family Medicine

## 2022-07-30 ENCOUNTER — Ambulatory Visit: Payer: Managed Care, Other (non HMO) | Admitting: Family Medicine

## 2022-07-30 VITALS — BP 128/86 | HR 71 | Ht 65.0 in | Wt 172.0 lb

## 2022-07-30 DIAGNOSIS — H7292 Unspecified perforation of tympanic membrane, left ear: Secondary | ICD-10-CM

## 2022-07-30 DIAGNOSIS — H6692 Otitis media, unspecified, left ear: Secondary | ICD-10-CM

## 2022-07-30 NOTE — Assessment & Plan Note (Signed)
TM has healed.  Still with some fluid behind the TM.  Recommend adding flonase daily to help with eustachian tube dysfunction.

## 2022-07-30 NOTE — Progress Notes (Signed)
Debbie Gallegos - 36 y.o. female MRN [id number]  Date of birth: 1986/04/14  Subjective Chief Complaint  Patient presents with   Ear Pain    HPI Debbie Gallegos is a 36 y.o. female here today for follow up of OM with rupture of the L TM.  She reports that she is feeling better overall.  Hearing is improved.  Drainage from the ear has stopped.  Still has pressure in the ear if leaning over.  No new fever/chills.  No vertigo symptoms.   ROS:  A comprehensive ROS was completed and negative except as noted per HPI  Allergies  Allergen Reactions   Phenergan [Promethazine Hcl] Other (See Comments)    Lock jaw, neck stiffness    Past Medical History:  Diagnosis Date   Family history of breast cancer    Family history of kidney cancer    Family history of melanoma    Family history of prostate cancer    PCOS (polycystic ovarian syndrome)     Past Surgical History:  Procedure Laterality Date   CESAREAN SECTION     CHOLECYSTECTOMY, LAPAROSCOPIC     TUBAL LIGATION      Social History   Socioeconomic History   Marital status: Married    Spouse name: Jonay Hitchcock   Number of children: 2   Years of education: Not on file   Highest education level: High school graduate  Occupational History   Occupation: Stay At Home Mother  Tobacco Use   Smoking status: Former    Types: Cigarettes    Quit date: 10/11/2015    Years since quitting: 6.8   Smokeless tobacco: Never  Vaping Use   Vaping Use: Never used  Substance and Sexual Activity   Alcohol use: Not Currently   Drug use: Never   Sexual activity: Yes    Partners: Male    Birth control/protection: None    Comment: Tubal Ligation  Other Topics Concern   Not on file  Social History Narrative   Not on file   Social Determinants of Health   Financial Resource Strain: Not on file  Food Insecurity: Not on file  Transportation Needs: Not on file  Physical Activity: Not on file  Stress: Not on file  Social Connections: Not on file     Family History  Problem Relation Age of Onset   Hypertension Mother    Diabetes Mother    Stroke Mother    Breast cancer Mother 15       HER2+   Melanoma Father        ? due to agent orange   Other Father        cancerous nasal polyps   Heart disease Maternal Grandmother    Kidney cancer Maternal Grandfather 83       encapsulated   Breast cancer Maternal Aunt 54   Prostate cancer Paternal Grandfather    Uterine cancer Other 65       MGF's mother    Health Maintenance  Topic Date Due   COVID-19 Vaccine (3 - 2023-24 season) 09/12/2022 (Originally 04/12/2022)   INFLUENZA VACCINE  11/10/2022 (Originally 03/12/2022)   Hepatitis C Screening  07/17/2023 (Originally 08/16/2001)   HIV Screening  07/17/2023 (Originally 08/16/1998)   DTaP/Tdap/Td (2 - Td or Tdap) 08/12/2025   PAP SMEAR-Modifier  08/31/2026   HPV VACCINES  Aged Out     ----------------------------------------------------------------------------------------------------------------------------------------------------------------------------------------------------------------- Physical Exam BP 128/86 (BP Location: Left Arm, Patient Position: Sitting, Cuff Size: Normal)   Pulse 71  Ht _0  (1.651 m)   Wt 172 lb (78 kg)   LMP 06/05/2022 (Approximate)   SpO2 97%   BMI 28.62 kg/m   Physical Exam Constitutional:      Appearance: Normal appearance.  HENT:     Head: Normocephalic and atraumatic.     Right Ear: Tympanic membrane normal.     Ears:     Comments: L TM healed.  Serous effusion present.   Eyes:     General: No scleral icterus. Musculoskeletal:     Cervical back: Neck supple.  Neurological:     Mental Status: She is alert.  Psychiatric:        Mood and Affect: Mood normal.        Behavior: Behavior normal.      ------------------------------------------------------------------------------------------------------------------------------------------------------------------------------------------------------------------- Assessment and Plan  Otitis media with rupture of tympanic membrane, left TM has healed.  Still with some fluid behind the TM.  Recommend adding flonase daily to help with eustachian tube dysfunction.     No orders of the defined types were placed in this encounter.   No follow-ups on file.    This visit occurred during the SARS-CoV-2 public health emergency.  Safety protocols were in place, including screening questions prior to the visit, additional usage of staff PPE, and extensive cleaning of exam room while observing appropriate contact time as indicated for disinfecting solutions.

## 2022-08-13 ENCOUNTER — Ambulatory Visit: Payer: Managed Care, Other (non HMO) | Admitting: Family Medicine

## 2022-08-13 ENCOUNTER — Encounter: Payer: Self-pay | Admitting: Family Medicine

## 2022-08-13 VITALS — BP 136/80 | HR 99 | Temp 98.3°F | Ht 65.0 in | Wt 183.1 lb

## 2022-08-13 DIAGNOSIS — L853 Xerosis cutis: Secondary | ICD-10-CM | POA: Diagnosis not present

## 2022-08-13 MED ORDER — TRIAMCINOLONE ACETONIDE 0.1 % EX CREA: 1.0000 | TOPICAL_CREAM | Freq: Two times a day (BID) | CUTANEOUS | 0 refills | Status: DC | PRN

## 2022-08-13 MED ORDER — HYDROXYZINE HCL 10 MG PO TABS: 10.0000 mg | ORAL_TABLET | Freq: Three times a day (TID) | ORAL | 0 refills | Status: DC | PRN

## 2022-08-13 NOTE — Progress Notes (Signed)
Acute Office Visit  Subjective:     Patient ID: Debbie Gallegos, female    DOB: [birth year], 37 y.o.   MRN: [id number]  Chief Complaint  Patient presents with   Rash    Rash Right lower back x noticed 2 days ago - has appearance of shingles -also,  entire body itching varying in location- scratching does not relieve the itching. -x couple of weeks- no new foods or detergents.Benadryl does not help itch but does allow patient to sleep     Rash  Patient is in today for acute visit. Pt is new to me today.  Pt reports itchy skin for the last several weeks. She says it's underneath her skin. She denies new exposures, on new foods or detergents. Uses the same body wash. Does report taking hot showers mostly and doesn't moisturize. She has one area on her back with clustered macules but doesn't have any numbness, tingling, or pain to the area. This has been present for the last 4 days. She denies worsening of pruritus with showers. Tried benadryl for the itching and has helped with sleep but not with the itching.  Pt only taking Metformin for PCOS. She is taking a break from Adipex and hasn't been on this for the last month.   Patient Active Problem List   Diagnosis Date Noted   Otitis media with rupture of tympanic membrane, left 07/16/2022   Ureteral stone 09/26/2021   Well adult exam 09/02/2021   Abnormal weight gain 05/31/2021   Breast pain, left 05/31/2021   Knee pain 01/29/2021   Family history of breast cancer    Family history of kidney cancer    Family history of prostate cancer    Family history of melanoma    PCOS (polycystic ovarian syndrome) 05/17/2020   Screening for lipid disorders 05/17/2020   Family history of breast cancer in first degree relative 05/17/2020     Review of Systems  Skin:  Positive for itching and rash.  All other systems reviewed and are negative.       Objective:    BP 136/80   Pulse 99   Temp 98.3 F (36.8 C)   Ht 5\' 5"  (1.651 m)   Wt 183  lb 2 oz (83.1 kg)   SpO2 100%   BMI 30.47 kg/m    Physical Exam Vitals and nursing note reviewed.  Constitutional:      Appearance: She is normal weight.  HENT:     Head: Normocephalic and atraumatic.     Mouth/Throat:     Pharynx: Oropharynx is clear.  Cardiovascular:     Rate and Rhythm: Normal rate.  Pulmonary:     Effort: Pulmonary effort is normal.  Skin:    General: Skin is warm and dry.     Capillary Refill: Capillary refill takes less than 2 seconds.     Comments: Dry scaly skin diffuse  Neurological:     General: No focal deficit present.     Mental Status: She is alert and oriented to person, place, and time. Mental status is at baseline.    No results found for any visits on 08/13/22.      Assessment & Plan:   Problem List Items Addressed This Visit   None Visit Diagnoses     Xerosis of skin    -  Primary   Relevant Medications   hydrOXYzine (ATARAX) 10 MG tablet   triamcinolone cream (KENALOG) 0.1 %   Other Relevant Orders  CBC with Differential/Platelet       Meds ordered this encounter  Medications   hydrOXYzine (ATARAX) 10 MG tablet    Sig: Take 1 tablet (10 mg total) by mouth 3 (three) times daily as needed.    Dispense:  30 tablet    Refill:  0   triamcinolone cream (KENALOG) 0.1 %    Sig: Apply 1 Application topically 2 (two) times daily as needed.    Dispense:  30 g    Refill:  0  No new exposures or medicines. Suspect dry skin, I.e xerosis. Advised pt to moisturize after bathing and will also check iron levels CBC, to rule out polycythemia vera.  Explained to pt that shingles presentation is usually one sided, dermatomal and involves pain/burning sensations with papular type rash which pt doesn't have.   Return if symptoms worsen or fail to improve.  Leeanne Rio, MD

## 2022-08-14 LAB — CBC WITH DIFFERENTIAL/PLATELET
Basophils Absolute: 0.1 10*3/uL (ref 0.0–0.2)
Basos: 1 %
EOS (ABSOLUTE): 0.4 10*3/uL (ref 0.0–0.4)
Eos: 5 %
Hematocrit: 40.9 % (ref 34.0–46.6)
Hemoglobin: 13.6 g/dL (ref 11.1–15.9)
Immature Grans (Abs): 0 10*3/uL (ref 0.0–0.1)
Immature Granulocytes: 0 %
Lymphocytes Absolute: 2.3 10*3/uL (ref 0.7–3.1)
Lymphs: 29 %
MCH: 27.1 pg (ref 26.6–33.0)
MCHC: 33.3 g/dL (ref 31.5–35.7)
MCV: 82 fL (ref 79–97)
Monocytes Absolute: 0.4 10*3/uL (ref 0.1–0.9)
Monocytes: 6 %
Neutrophils Absolute: 4.5 10*3/uL (ref 1.4–7.0)
Neutrophils: 59 %
Platelets: 270 10*3/uL (ref 150–450)
RBC: 5.02 x10E6/uL (ref 3.77–5.28)
RDW: 13.7 % (ref 11.7–15.4)
WBC: 7.7 10*3/uL (ref 3.4–10.8)

## 2022-08-15 ENCOUNTER — Ambulatory Visit: Payer: Managed Care, Other (non HMO) | Admitting: Family Medicine

## 2022-08-15 ENCOUNTER — Encounter: Payer: Self-pay | Admitting: Family Medicine

## 2022-08-15 VITALS — BP 128/85 | HR 79 | Ht 65.0 in | Wt 185.0 lb

## 2022-08-15 DIAGNOSIS — L259 Unspecified contact dermatitis, unspecified cause: Secondary | ICD-10-CM | POA: Diagnosis not present

## 2022-08-15 DIAGNOSIS — R21 Rash and other nonspecific skin eruption: Secondary | ICD-10-CM

## 2022-08-15 MED ORDER — PREDNISONE 5 MG (48) PO TBPK: ORAL_TABLET | ORAL | 0 refills | Status: DC

## 2022-08-15 NOTE — Progress Notes (Signed)
Debbie Gallegos - 37 y.o. female MRN [id number]  Date of birth: 14-Nov-1985  Subjective Chief Complaint  Patient presents with   Rash    HPI Debbie Gallegos is a 37 y.o. female here today with complaint of rash.  She has had rash for a few days.  Initially started on right flank area but has not spread to arms, top of feet and between breasts.  Rash is itchy with some slight burning with scratching.  Seen by Dr. Huel Cote yesterday and told that she had dry skin.  She does not feel that this is the cause.  She denies any fever or chills.  No drainage from rash.  Has not had any changes to soaps, detergents, medications or new foods.  She was prescribed hydroxyzine however this just made her sleepy.  She has not tried triamcinolone cream that was prescribed yet.  ROS:  A comprehensive ROS was completed and negative except as noted per HPI  Allergies  Allergen Reactions   Phenergan [Promethazine Hcl] Other (See Comments)    Lock jaw, neck stiffness    Past Medical History:  Diagnosis Date   Family history of breast cancer    Family history of kidney cancer    Family history of melanoma    Family history of prostate cancer    PCOS (polycystic ovarian syndrome)     Past Surgical History:  Procedure Laterality Date   CESAREAN SECTION     CHOLECYSTECTOMY, LAPAROSCOPIC     TUBAL LIGATION      Social History   Socioeconomic History   Marital status: Married    Spouse name: Ellanie Oppedisano   Number of children: 2   Years of education: Not on file   Highest education level: High school graduate  Occupational History   Occupation: Stay At Home Mother  Tobacco Use   Smoking status: Former    Types: Cigarettes    Quit date: 10/11/2015    Years since quitting: 6.8   Smokeless tobacco: Never  Vaping Use   Vaping Use: Never used  Substance and Sexual Activity   Alcohol use: Not Currently   Drug use: Never   Sexual activity: Yes    Partners: Male    Birth control/protection: None     Comment: Tubal Ligation  Other Topics Concern   Not on file  Social History Narrative   Not on file   Social Determinants of Health   Financial Resource Strain: Not on file  Food Insecurity: Not on file  Transportation Needs: Not on file  Physical Activity: Not on file  Stress: Not on file  Social Connections: Not on file    Family History  Problem Relation Age of Onset   Hypertension Mother    Diabetes Mother    Stroke Mother    Breast cancer Mother 24       HER2+   Melanoma Father        ? due to agent orange   Other Father        cancerous nasal polyps   Heart disease Maternal Grandmother    Kidney cancer Maternal Grandfather 83       encapsulated   Breast cancer Maternal Aunt 54   Prostate cancer Paternal Grandfather    Uterine cancer Other 7       MGF's mother    Health Maintenance  Topic Date Due   COVID-19 Vaccine (3 - 2023-24 season) 09/12/2022 (Originally 04/12/2022)   INFLUENZA VACCINE  11/10/2022 (Originally 03/12/2022)  Hepatitis C Screening  07/17/2023 (Originally 08/16/2001)   HIV Screening  07/17/2023 (Originally 08/16/1998)   DTaP/Tdap/Td (2 - Td or Tdap) 08/12/2025   PAP SMEAR-Modifier  08/31/2026   HPV VACCINES  Aged Out     ----------------------------------------------------------------------------------------------------------------------------------------------------------------------------------------------------------------- Physical Exam BP 128/85 (BP Location: Left Arm, Patient Position: Sitting, Cuff Size: Normal)   Pulse 79   Ht _0  (1.651 m)   Wt 185 lb (83.9 kg)   SpO2 97%   BMI 30.79 kg/m   Physical Exam Constitutional:      Appearance: Normal appearance.  Eyes:     General: No scleral icterus. Skin:    Comments: Slightly raised papular rash along the right lower flank area. Excoriated area along the left flank. Erythematous patches between the upper breast. Excoriated areas on feet as well as left arm. No skin swelling  noted along the hands or feet  Neurological:     Mental Status: She is alert.     ------------------------------------------------------------------------------------------------------------------------------------------------------------------------------------------------------------------- Assessment and Plan  Contact dermatitis Symptoms are more consistent with contact dermatitis.  Recommend using triamcinolone on affected areas.  Adding steroid taper.  She will let me know if not improving with this.  Differential includes eczema or scabies however I do not see any evidence of burrowing or tunneling.   Meds ordered this encounter  Medications   predniSONE (STERAPRED UNI-PAK 48 TAB) 5 MG (48) TBPK tablet    Sig: Taper as directed on packaging. 12 day taper    Dispense:  48 tablet    Refill:  0    No follow-ups on file.    This visit occurred during the SARS-CoV-2 public health emergency.  Safety protocols were in place, including screening questions prior to the visit, additional usage of staff PPE, and extensive cleaning of exam room while observing appropriate contact time as indicated for disinfecting solutions.

## 2022-08-15 NOTE — Patient Instructions (Signed)
Start prednisone taper.  We'll be in touch with lab results.  Let me know if not improving or worsening.

## 2022-08-15 NOTE — Assessment & Plan Note (Signed)
Symptoms are more consistent with contact dermatitis.  Recommend using triamcinolone on affected areas.  Adding steroid taper.  She will let me know if not improving with this.  Differential includes eczema or scabies however I do not see any evidence of burrowing or tunneling.

## 2022-08-16 LAB — CBC WITH DIFFERENTIAL/PLATELET
Absolute Monocytes: 352 cells/uL (ref 200–950)
Basophils Absolute: 62 cells/uL (ref 0–200)
Basophils Relative: 0.9 %
Eosinophils Absolute: 283 cells/uL (ref 15–500)
Eosinophils Relative: 4.1 %
HCT: 41.5 % (ref 35.0–45.0)
Hemoglobin: 14.2 g/dL (ref 11.7–15.5)
Lymphs Abs: 2201 cells/uL (ref 850–3900)
MCH: 27.8 pg (ref 27.0–33.0)
MCHC: 34.2 g/dL (ref 32.0–36.0)
MCV: 81.4 fL (ref 80.0–100.0)
MPV: 10.4 fL (ref 7.5–12.5)
Monocytes Relative: 5.1 %
Neutro Abs: 4002 cells/uL (ref 1500–7800)
Neutrophils Relative %: 58 %
Platelets: 299 10*3/uL (ref 140–400)
RBC: 5.1 10*6/uL (ref 3.80–5.10)
RDW: 13.6 % (ref 11.0–15.0)
Total Lymphocyte: 31.9 %
WBC: 6.9 10*3/uL (ref 3.8–10.8)

## 2022-08-16 LAB — COMPLETE METABOLIC PANEL WITH GFR
AG Ratio: 1.7 (calc) (ref 1.0–2.5)
ALT: 24 U/L (ref 6–29)
AST: 14 U/L (ref 10–30)
Albumin: 4.6 g/dL (ref 3.6–5.1)
Alkaline phosphatase (APISO): 45 U/L (ref 31–125)
BUN: 11 mg/dL (ref 7–25)
CO2: 28 mmol/L (ref 20–32)
Calcium: 9.8 mg/dL (ref 8.6–10.2)
Chloride: 105 mmol/L (ref 98–110)
Creat: 0.68 mg/dL (ref 0.50–0.97)
Globulin: 2.7 g/dL (calc) (ref 1.9–3.7)
Glucose, Bld: 88 mg/dL (ref 65–99)
Potassium: 4.9 mmol/L (ref 3.5–5.3)
Sodium: 141 mmol/L (ref 135–146)
Total Bilirubin: 0.4 mg/dL (ref 0.2–1.2)
Total Protein: 7.3 g/dL (ref 6.1–8.1)
eGFR: 114 mL/min/{1.73_m2} (ref >=60)

## 2022-08-25 ENCOUNTER — Encounter: Payer: Self-pay | Admitting: Family Medicine

## 2022-08-25 DIAGNOSIS — E7849 Other hyperlipidemia: Secondary | ICD-10-CM

## 2022-08-25 DIAGNOSIS — E782 Mixed hyperlipidemia: Secondary | ICD-10-CM

## 2022-08-25 DIAGNOSIS — Z0000 Encounter for general adult medical examination without abnormal findings: Secondary | ICD-10-CM

## 2022-08-30 MED ORDER — CLOTRIMAZOLE-BETAMETHASONE 1-0.05 % EX CREA: 1.0000 | TOPICAL_CREAM | Freq: Every day | CUTANEOUS | 0 refills | Status: DC

## 2022-09-10 ENCOUNTER — Other Ambulatory Visit: Payer: Self-pay | Admitting: Family Medicine

## 2022-09-10 DIAGNOSIS — R21 Rash and other nonspecific skin eruption: Secondary | ICD-10-CM

## 2022-09-11 NOTE — Addendum Note (Signed)
Addended by: Narda Rutherford on: 09/11/2022 11:47 AM   Modules accepted: Orders

## 2022-09-18 ENCOUNTER — Encounter: Payer: Self-pay | Admitting: Family Medicine

## 2022-09-18 ENCOUNTER — Ambulatory Visit: Payer: Managed Care, Other (non HMO) | Admitting: Family Medicine

## 2022-09-18 VITALS — BP 109/74 | HR 82 | Ht 65.0 in | Wt 185.0 lb

## 2022-09-18 DIAGNOSIS — E785 Hyperlipidemia, unspecified: Secondary | ICD-10-CM | POA: Diagnosis not present

## 2022-09-18 DIAGNOSIS — R21 Rash and other nonspecific skin eruption: Secondary | ICD-10-CM | POA: Insufficient documentation

## 2022-09-18 NOTE — Assessment & Plan Note (Signed)
Awaiting results of recent biopsy.  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

## 2022-09-18 NOTE — Progress Notes (Signed)
Debbie Gallegos - 37 y.o. female MRN [id number]  Date of birth: 02/02/1986  Subjective Chief Complaint  Patient presents with   Rash    HPI Debbie Gallegos is a 37 year old female here today for follow-up visit.  Continues to have rash along the right flank area.  Recently seen by dermatology and had biopsy.  Did not respond to steroids or antifungals.  She did have viral illness a few weeks preceding rash.  She does report family history of Hashimoto's thyroiditis.  She would like to have thyroid and blood sugar evaluation.    ROS:  A comprehensive ROS was completed and negative except as noted per HPI  Allergies  Allergen Reactions   Phenergan [Promethazine Hcl] Other (See Comments)    Lock jaw, neck stiffness    Past Medical History:  Diagnosis Date   Family history of breast cancer    Family history of kidney cancer    Family history of melanoma    Family history of prostate cancer    PCOS (polycystic ovarian syndrome)     Past Surgical History:  Procedure Laterality Date   CESAREAN SECTION     CHOLECYSTECTOMY, LAPAROSCOPIC     TUBAL LIGATION      Social History   Socioeconomic History   Marital status: Married    Spouse name: Blasa Raisch   Number of children: 2   Years of education: Not on file   Highest education level: High school graduate  Occupational History   Occupation: Stay At Home Mother  Tobacco Use   Smoking status: Former    Types: Cigarettes    Quit date: 10/11/2015    Years since quitting: 6.9   Smokeless tobacco: Never  Vaping Use   Vaping Use: Never used  Substance and Sexual Activity   Alcohol use: Not Currently   Drug use: Never   Sexual activity: Yes    Partners: Male    Birth control/protection: None    Comment: Tubal Ligation  Other Topics Concern   Not on file  Social History Narrative   Not on file   Social Determinants of Health   Financial Resource Strain: Not on file  Food Insecurity: Not on file  Transportation Needs:  Not on file  Physical Activity: Not on file  Stress: Not on file  Social Connections: Not on file    Family History  Problem Relation Age of Onset   Hypertension Mother    Diabetes Mother    Stroke Mother    Breast cancer Mother 58       HER2+   Melanoma Father        ? due to agent orange   Other Father        cancerous nasal polyps   Heart disease Maternal Grandmother    Kidney cancer Maternal Grandfather 83       encapsulated   Breast cancer Maternal Aunt 54   Prostate cancer Paternal Grandfather    Uterine cancer Other 54       MGF's mother    Health Maintenance  Topic Date Due   INFLUENZA VACCINE  11/10/2022 (Originally 03/12/2022)   Hepatitis C Screening  07/17/2023 (Originally 08/16/2001)   HIV Screening  07/17/2023 (Originally 08/16/1998)   COVID-19 Vaccine (3 - 2023-24 season) 10/05/2023 (Originally 04/12/2022)   DTaP/Tdap/Td (2 - Td or Tdap) 08/12/2025   PAP SMEAR-Modifier  08/31/2026   HPV VACCINES  Aged Out     ----------------------------------------------------------------------------------------------------------------------------------------------------------------------------------------------------------------- Physical Exam BP 109/74 (BP Location: Left Arm, Patient  Position: Sitting, Cuff Size: Large)   Pulse 82   Ht 5\' 5"  (1.651 m)   Wt 185 lb (83.9 kg)   SpO2 100%   BMI 30.79 kg/m   Physical Exam Constitutional:      Appearance: Normal appearance.  Skin:    Comments: Annular appearing rash on the right flank that is scaly in appearance.  Neurological:     Mental Status: She is alert.  Psychiatric:        Mood and Affect: Mood normal.        Behavior: Behavior normal.     ------------------------------------------------------------------------------------------------------------------------------------------------------------------------------------------------------------------- Assessment and Plan  Rash Awaiting results of recent biopsy.   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   No orders of the defined types were placed in this encounter.   No follow-ups on file.    This visit occurred during the SARS-CoV-2 public health emergency.  Safety protocols were in place, including screening questions prior to the visit, additional usage of staff PPE, and extensive cleaning of exam room while observing appropriate contact time as indicated for disinfecting solutions.

## 2022-09-20 LAB — COMPLETE METABOLIC PANEL WITH GFR
AG Ratio: 1.6 (calc) (ref 1.0–2.5)
ALT: 14 U/L (ref 6–29)
AST: 12 U/L (ref 10–30)
Albumin: 4.3 g/dL (ref 3.6–5.1)
Alkaline phosphatase (APISO): 40 U/L (ref 31–125)
BUN: 12 mg/dL (ref 7–25)
CO2: 29 mmol/L (ref 20–32)
Calcium: 9.2 mg/dL (ref 8.6–10.2)
Chloride: 106 mmol/L (ref 98–110)
Creat: 0.76 mg/dL (ref 0.50–0.97)
Globulin: 2.7 g/dL (calc) (ref 1.9–3.7)
Glucose, Bld: 87 mg/dL (ref 65–99)
Potassium: 4.6 mmol/L (ref 3.5–5.3)
Sodium: 142 mmol/L (ref 135–146)
Total Bilirubin: 0.4 mg/dL (ref 0.2–1.2)
Total Protein: 7 g/dL (ref 6.1–8.1)
eGFR: 102 mL/min/{1.73_m2} (ref >=60)

## 2022-09-20 LAB — CBC WITH DIFFERENTIAL/PLATELET
Absolute Monocytes: 276 cells/uL (ref 200–950)
Basophils Absolute: 52 cells/uL (ref 0–200)
Basophils Relative: 1 %
Eosinophils Absolute: 250 cells/uL (ref 15–500)
Eosinophils Relative: 4.8 %
HCT: 40 % (ref 35.0–45.0)
Hemoglobin: 13.6 g/dL (ref 11.7–15.5)
Lymphs Abs: 1576 cells/uL (ref 850–3900)
MCH: 28.3 pg (ref 27.0–33.0)
MCHC: 34 g/dL (ref 32.0–36.0)
MCV: 83.2 fL (ref 80.0–100.0)
MPV: 10.2 fL (ref 7.5–12.5)
Monocytes Relative: 5.3 %
Neutro Abs: 3047 cells/uL (ref 1500–7800)
Neutrophils Relative %: 58.6 %
Platelets: 318 10*3/uL (ref 140–400)
RBC: 4.81 10*6/uL (ref 3.80–5.10)
RDW: 13.4 % (ref 11.0–15.0)
Total Lymphocyte: 30.3 %
WBC: 5.2 10*3/uL (ref 3.8–10.8)

## 2022-09-20 LAB — LIPID PANEL W/REFLEX DIRECT LDL
Cholesterol: 217 mg/dL — ABNORMAL HIGH (ref <200)
HDL: 63 mg/dL (ref >=50)
LDL Cholesterol (Calc): 136 mg/dL (calc) — ABNORMAL HIGH
Non-HDL Cholesterol (Calc): 154 mg/dL (calc) — ABNORMAL HIGH (ref <130)
Total CHOL/HDL Ratio: 3.4 (calc) (ref <5.0)
Triglycerides: 83 mg/dL (ref <150)

## 2022-09-20 LAB — HEMOGLOBIN A1C
Hgb A1c MFr Bld: 5.5 % of total Hgb (ref <5.7)
Mean Plasma Glucose: 111 mg/dL
eAG (mmol/L): 6.2 mmol/L

## 2022-09-20 LAB — TSH+FREE T4: TSH W/REFLEX TO FT4: 3.18 mIU/L

## 2022-09-20 LAB — THYROID PEROXIDASE ANTIBODIES (TPO) (REFL): Thyroperoxidase Ab SerPl-aCnc: 322 IU/mL — ABNORMAL HIGH (ref <9)

## 2022-10-08 ENCOUNTER — Encounter: Payer: Self-pay | Admitting: Family Medicine

## 2022-10-08 ENCOUNTER — Ambulatory Visit: Payer: Managed Care, Other (non HMO) | Admitting: Family Medicine

## 2022-10-08 VITALS — BP 128/86 | HR 79 | Ht 65.0 in | Wt 186.0 lb

## 2022-10-08 DIAGNOSIS — R768 Other specified abnormal immunological findings in serum: Secondary | ICD-10-CM

## 2022-10-08 DIAGNOSIS — L508 Other urticaria: Secondary | ICD-10-CM | POA: Diagnosis not present

## 2022-10-08 DIAGNOSIS — R21 Rash and other nonspecific skin eruption: Secondary | ICD-10-CM | POA: Diagnosis not present

## 2022-10-08 DIAGNOSIS — Z8349 Family history of other endocrine, nutritional and metabolic diseases: Secondary | ICD-10-CM | POA: Diagnosis not present

## 2022-10-08 DIAGNOSIS — R7689 Other specified abnormal immunological findings in serum: Secondary | ICD-10-CM | POA: Insufficient documentation

## 2022-10-08 MED ORDER — FLUTICASONE PROPIONATE 50 MCG/ACT NA SUSP: 2.0000 | Freq: Every day | NASAL | 6 refills | Status: DC

## 2022-10-08 MED ORDER — CETIRIZINE HCL 10 MG PO TABS: 10.0000 mg | ORAL_TABLET | Freq: Every day | ORAL | 11 refills | Status: DC

## 2022-10-08 NOTE — Assessment & Plan Note (Signed)
We discussed that presence of anti-TPO antibodies can lead to Hashimoto's thyroiditis.  Currently her thyroid is functioning normally based on recent labs.  She does have some recurrent urticaria which could possibly be related to this.  Will plan to recheck TSH levels in about 6 months.

## 2022-10-08 NOTE — Patient Instructions (Signed)
Let's recheck thyroid function in 5-6 months.  Try cetirizine and flonase.  Let me know if this does not help.  I would recommend an allergist if this continues.

## 2022-10-08 NOTE — Assessment & Plan Note (Signed)
Will add on cetirizine.  She will let me know if this is not effective.  Will refer to allergist if this continues to be an issue.

## 2022-10-08 NOTE — Assessment & Plan Note (Signed)
Lab testing ordered for MTHFR gene mutation.

## 2022-10-08 NOTE — Assessment & Plan Note (Signed)
Biopsy-proven fungal infection.  She will continue terbinafine as managed by dermatology.

## 2022-10-08 NOTE — Progress Notes (Signed)
Debbie Gallegos - 37 y.o. female MRN [id number]  Date of birth: March 06, 1986  Subjective Chief Complaint  Patient presents with   Labs Only    HPI Debbie Gallegos is a 37 y.o. female here today for follow up.   Seen by dermatology for rash recently.  Bx indicated that this was fungal in nature.  She had tried topical clotrimazole without improvement.  Given PO antifungals and topical antifungals. She reports that  rash has improved some but is still present.  Still has several days of terbinafine.   TPO antibodies elevated on recent labs. TSH and free T4 are normal. She had several questions today regarding this.  She does report that she continues to have recurrent hives.  She also reports that in addition to thyroid disease in her family she recently found out that there is a history of hemochromatosis as well as celiac and MTHFR gene mutation.  She denies any symptoms related to celiac.  ROS:  A comprehensive ROS was completed and negative except as noted per HPI   Allergies  Allergen Reactions   Phenergan [Promethazine Hcl] Other (See Comments)    Lock jaw, neck stiffness    Past Medical History:  Diagnosis Date   Family history of breast cancer    Family history of kidney cancer    Family history of melanoma    Family history of prostate cancer    PCOS (polycystic ovarian syndrome)     Past Surgical History:  Procedure Laterality Date   CESAREAN SECTION     CHOLECYSTECTOMY, LAPAROSCOPIC     TUBAL LIGATION      Social History   Socioeconomic History   Marital status: Married    Spouse name: Tayzia Soble   Number of children: 2   Years of education: Not on file   Highest education level: High school graduate  Occupational History   Occupation: Stay At Home Mother  Tobacco Use   Smoking status: Former    Types: Cigarettes    Quit date: 10/11/2015    Years since quitting: 6.9   Smokeless tobacco: Never  Vaping Use   Vaping Use: Never used  Substance and Sexual  Activity   Alcohol use: Not Currently   Drug use: Never   Sexual activity: Yes    Partners: Male    Birth control/protection: None    Comment: Tubal Ligation  Other Topics Concern   Not on file  Social History Narrative   Not on file   Social Determinants of Health   Financial Resource Strain: Not on file  Food Insecurity: Not on file  Transportation Needs: Not on file  Physical Activity: Not on file  Stress: Not on file  Social Connections: Not on file    Family History  Problem Relation Age of Onset   Hypertension Mother    Diabetes Mother    Stroke Mother    Breast cancer Mother 78       HER2+   Melanoma Father        ? due to agent orange   Other Father        cancerous nasal polyps   Heart disease Maternal Grandmother    Kidney cancer Maternal Grandfather 83       encapsulated   Breast cancer Maternal Aunt 54   Prostate cancer Paternal Grandfather    Uterine cancer Other 67       MGF's mother    Health Maintenance  Topic Date Due   INFLUENZA VACCINE  11/10/2022 (Originally 03/12/2022)   Hepatitis C Screening  07/17/2023 (Originally 08/16/2001)   HIV Screening  07/17/2023 (Originally 08/16/1998)   COVID-19 Vaccine (3 - 2023-24 season) 10/05/2023 (Originally 04/12/2022)   DTaP/Tdap/Td (2 - Td or Tdap) 08/12/2025   PAP SMEAR-Modifier  08/31/2026   HPV VACCINES  Aged Out     ----------------------------------------------------------------------------------------------------------------------------------------------------------------------------------------------------------------- Physical Exam BP 128/86 (BP Location: Left Arm, Patient Position: Sitting, Cuff Size: Large)   Pulse 79   Ht '5\' 5"'$  (1.651 m)   Wt 186 lb (84.4 kg)   SpO2 100%   BMI 30.95 kg/m   Physical Exam Constitutional:      Appearance: Normal appearance.  Musculoskeletal:        General: Normal range of motion.  Neurological:     Mental Status: She is alert.  Psychiatric:        Mood  and Affect: Mood normal.        Behavior: Behavior normal.     ------------------------------------------------------------------------------------------------------------------------------------------------------------------------------------------------------------------- Assessment and Plan  Anti-TPO antibodies present We discussed that presence of anti-TPO antibodies can lead to Hashimoto's thyroiditis.  Currently her thyroid is functioning normally based on recent labs.  She does have some recurrent urticaria which could possibly be related to this.  Will plan to recheck TSH levels in about 6 months.  Family history of MTHFR deficiency Lab testing ordered for MTHFR gene mutation.  Family history of hemochromatosis Iron and ferritin panel ordered.  Rash Biopsy-proven fungal infection.  She will continue terbinafine as managed by dermatology.  Chronic urticaria Will add on cetirizine.  She will let me know if this is not effective.  Will refer to allergist if this continues to be an issue.   Meds ordered this encounter  Medications   fluticasone (FLONASE) 50 MCG/ACT nasal spray    Sig: Place 2 sprays into both nostrils daily.    Dispense:  16 g    Refill:  6   cetirizine (ZYRTEC) 10 MG tablet    Sig: Take 1 tablet (10 mg total) by mouth daily.    Dispense:  30 tablet    Refill:  11    No follow-ups on file.    This visit occurred during the SARS-CoV-2 public health emergency.  Safety protocols were in place, including screening questions prior to the visit, additional usage of staff PPE, and extensive cleaning of exam room while observing appropriate contact time as indicated for disinfecting solutions.

## 2022-10-08 NOTE — Assessment & Plan Note (Addendum)
Iron and ferritin panel ordered.

## 2022-10-14 LAB — IRON,TIBC AND FERRITIN PANEL
%SAT: 18 % (calc) (ref 16–45)
Ferritin: 15 ng/mL — ABNORMAL LOW (ref 16–154)
Iron: 75 ug/dL (ref 40–190)
TIBC: 419 mcg/dL (calc) (ref 250–450)

## 2022-10-14 LAB — MTHFR DNA ANALYSIS: Methylenetetrahydrofolate Reductase (MTHFR),DNA: POSITIVE

## 2022-10-14 LAB — B12 AND FOLATE PANEL
Folate: 8.2 ng/mL
Vitamin B-12: 341 pg/mL (ref 200–1100)

## 2022-10-15 ENCOUNTER — Other Ambulatory Visit: Payer: Self-pay | Admitting: Family Medicine

## 2022-10-15 ENCOUNTER — Encounter: Payer: Self-pay | Admitting: Family Medicine

## 2022-10-15 DIAGNOSIS — R635 Abnormal weight gain: Secondary | ICD-10-CM

## 2022-10-15 MED ORDER — PHENTERMINE HCL 37.5 MG PO CAPS: 37.5000 mg | ORAL_CAPSULE | Freq: Every morning | ORAL | 2 refills | Status: DC

## 2022-10-29 ENCOUNTER — Other Ambulatory Visit: Payer: Self-pay | Admitting: Family Medicine

## 2022-11-27 ENCOUNTER — Other Ambulatory Visit: Payer: Self-pay | Admitting: Family Medicine

## 2022-12-24 ENCOUNTER — Other Ambulatory Visit: Payer: Self-pay | Admitting: Family Medicine

## 2023-01-22 ENCOUNTER — Other Ambulatory Visit: Payer: Self-pay | Admitting: Family Medicine

## 2023-02-01 ENCOUNTER — Other Ambulatory Visit: Payer: Self-pay | Admitting: Family Medicine

## 2023-02-01 DIAGNOSIS — R635 Abnormal weight gain: Secondary | ICD-10-CM

## 2023-02-17 ENCOUNTER — Ambulatory Visit: Payer: Managed Care, Other (non HMO) | Admitting: Family Medicine

## 2023-02-17 VITALS — BP 125/85 | HR 108 | Ht 65.0 in | Wt 186.0 lb

## 2023-02-17 DIAGNOSIS — M791 Myalgia, unspecified site: Secondary | ICD-10-CM

## 2023-02-17 DIAGNOSIS — R7989 Other specified abnormal findings of blood chemistry: Secondary | ICD-10-CM

## 2023-02-17 DIAGNOSIS — R5383 Other fatigue: Secondary | ICD-10-CM

## 2023-02-17 DIAGNOSIS — N939 Abnormal uterine and vaginal bleeding, unspecified: Secondary | ICD-10-CM

## 2023-02-17 DIAGNOSIS — R768 Other specified abnormal immunological findings in serum: Secondary | ICD-10-CM | POA: Diagnosis not present

## 2023-02-17 DIAGNOSIS — E611 Iron deficiency: Secondary | ICD-10-CM

## 2023-02-17 NOTE — Progress Notes (Signed)
Debbie Gallegos - 37 y.o. female MRN [id number]  Date of birth: October 25, 1985  Subjective Chief Complaint  Patient presents with   Hypertension   Thyroid Problem    HPI  Debbie Gallegos is a 37 y.o. female here today for follow up visit.   Previously seen with rash noted to have elevated TPO antibodies on lab work.  She has had some increased weight gain recently as well as decreased energy levels.  She is also having some myalgias with increased recovery time after exercise.  She has not noted any significant joint swelling but does have some mild joint pain.  She has been taking iron supplements for iron deficiency.  Overall she is frustrated with how she is feeling.  She has also had some heavier menstrual cycles.  Bleeding has not been prolonged but has been much heavier.  She has increased cramping and more clots with periods.  ROS:  A comprehensive ROS was completed and negative except as noted per HPI    Allergies  Allergen Reactions   Phenergan [Promethazine Hcl] Other (See Comments)    Lock jaw, neck stiffness    Past Medical History:  Diagnosis Date   Family history of breast cancer    Family history of kidney cancer    Family history of melanoma    Family history of prostate cancer    PCOS (polycystic ovarian syndrome)     Past Surgical History:  Procedure Laterality Date   CESAREAN SECTION     CHOLECYSTECTOMY, LAPAROSCOPIC     TUBAL LIGATION      Social History   Socioeconomic History   Marital status: Married    Spouse name: Chariah Bailey   Number of children: 2   Years of education: Not on file   Highest education level: 12th grade  Occupational History   Occupation: Stay At Home Mother  Tobacco Use   Smoking status: Former    Current packs/day: 0.00    Types: Cigarettes    Quit date: 10/11/2015    Years since quitting: 7.3   Smokeless tobacco: Never  Vaping Use   Vaping status: Never Used  Substance and Sexual Activity   Alcohol use: Not Currently    Drug use: Never   Sexual activity: Yes    Partners: Male    Birth control/protection: None    Comment: Tubal Ligation  Other Topics Concern   Not on file  Social History Narrative   Not on file   Social Determinants of Health   Financial Resource Strain: Low Risk  (02/17/2023)   Overall Financial Resource Strain (CARDIA)    Difficulty of Paying Living Expenses: Not hard at all  Food Insecurity: No Food Insecurity (02/17/2023)   Hunger Vital Sign    Worried About Running Out of Food in the Last Year: Never true    Ran Out of Food in the Last Year: Never true  Transportation Needs: No Transportation Needs (02/17/2023)   PRAPARE - Administrator, Civil Service (Medical): No    Lack of Transportation (Non-Medical): No  Physical Activity: Insufficiently Active (02/17/2023)   Exercise Vital Sign    Days of Exercise per Week: 3 days    Minutes of Exercise per Session: 30 min  Stress: No Stress Concern Present (02/17/2023)   [employer] of Occupational Health - Occupational Stress Questionnaire    Feeling of Stress : Not at all  Social Connections: Moderately Isolated (02/17/2023)   Social Connection and Isolation Panel [NHANES]  Frequency of Communication with Friends and Family: More than three times a week    Frequency of Social Gatherings with Friends and Family: Once a week    Attends Religious Services: Never    Database administrator or Organizations: No    Attends Engineer, structural: Not on file    Marital Status: Married    Family History  Problem Relation Age of Onset   Hypertension Mother    Diabetes Mother    Stroke Mother    Breast cancer Mother 1       HER2+   Melanoma Father        ? due to agent orange   Other Father        cancerous nasal polyps   Heart disease Maternal Grandmother    Kidney cancer Maternal Grandfather 24       encapsulated   Breast cancer Maternal Aunt 54   Prostate cancer Paternal Grandfather    Uterine cancer  Other 76       MGF's mother    Health Maintenance  Topic Date Due   Hepatitis C Screening  07/17/2023 (Originally 08/16/2001)   HIV Screening  07/17/2023 (Originally 08/16/1998)   COVID-19 Vaccine (3 - 2023-24 season) 10/05/2023 (Originally 04/12/2022)   INFLUENZA VACCINE  03/13/2023   DTaP/Tdap/Td (2 - Td or Tdap) 08/12/2025   PAP SMEAR-Modifier  08/31/2026   HPV VACCINES  Aged Out     ----------------------------------------------------------------------------------------------------------------------------------------------------------------------------------------------------------------- Physical Exam BP 125/85 (BP Location: Left Arm, Patient Position: Sitting, Cuff Size: Large)   Pulse (!) 108   Ht 5\' 5"  (1.651 m)   Wt 186 lb (84.4 kg)   SpO2 97%   BMI 30.95 kg/m   Physical Exam Constitutional:      Appearance: Normal appearance.  HENT:     Head: Normocephalic and atraumatic.  Musculoskeletal:     Comments: The synovitis or significant joint pain noted.  Mild tenderness along soft tissue upper legs.  Neurological:     Mental Status: She is alert.  Psychiatric:        Mood and Affect: Mood normal.     ------------------------------------------------------------------------------------------------------------------------------------------------------------------------------------------------------------------- Assessment and Plan  Abnormal TSH Checking TSH and free T4.  She did have abnormal TPO antibodies and likely has Hashimoto's.  Abnormal uterine bleeding Poss related to thyroid abnormality.  Checking thyroid function and we will obtain pelvic ultrasound.  Iron deficiency Heavy menstrual cycles.  She has been taking iron supplement.  Update iron panel.  Myalgia Checking CK and autoimmune markers.   No orders of the defined types were placed in this encounter.   No follow-ups on file.    This visit occurred during the SARS-CoV-2 public health  emergency.  Safety protocols were in place, including screening questions prior to the visit, additional usage of staff PPE, and extensive cleaning of exam room while observing appropriate contact time as indicated for disinfecting solutions.

## 2023-02-18 ENCOUNTER — Other Ambulatory Visit: Payer: Self-pay | Admitting: Family Medicine

## 2023-02-18 DIAGNOSIS — Z1231 Encounter for screening mammogram for malignant neoplasm of breast: Secondary | ICD-10-CM

## 2023-02-18 LAB — SEDIMENTATION RATE: Sed Rate: 2 mm/h (ref 0–20)

## 2023-02-18 LAB — HEMOGLOBIN A1C: Mean Plasma Glucose: 114 mg/dL

## 2023-02-18 LAB — IRON,TIBC AND FERRITIN PANEL
Ferritin: 32 ng/mL (ref 16–154)
Iron: 181 ug/dL (ref 40–190)
TIBC: 375 mcg/dL (calc) (ref 250–450)

## 2023-02-18 LAB — CK: Total CK: 60 U/L (ref 29–143)

## 2023-02-18 LAB — C-REACTIVE PROTEIN: CRP: <3 mg/L (ref <8.0)

## 2023-02-19 ENCOUNTER — Other Ambulatory Visit: Payer: Self-pay | Admitting: Family Medicine

## 2023-02-19 ENCOUNTER — Ambulatory Visit: Payer: Managed Care, Other (non HMO)

## 2023-02-19 ENCOUNTER — Encounter: Payer: Self-pay | Admitting: Family Medicine

## 2023-02-19 DIAGNOSIS — N939 Abnormal uterine and vaginal bleeding, unspecified: Secondary | ICD-10-CM

## 2023-02-19 DIAGNOSIS — M791 Myalgia, unspecified site: Secondary | ICD-10-CM

## 2023-02-19 DIAGNOSIS — R21 Rash and other nonspecific skin eruption: Secondary | ICD-10-CM

## 2023-02-19 DIAGNOSIS — Z1231 Encounter for screening mammogram for malignant neoplasm of breast: Secondary | ICD-10-CM | POA: Diagnosis not present

## 2023-02-19 DIAGNOSIS — R768 Other specified abnormal immunological findings in serum: Secondary | ICD-10-CM

## 2023-02-19 DIAGNOSIS — L508 Other urticaria: Secondary | ICD-10-CM

## 2023-02-19 LAB — IRON,TIBC AND FERRITIN PANEL: %SAT: 48 % (calc) — ABNORMAL HIGH (ref 16–45)

## 2023-02-19 LAB — HEMOGLOBIN A1C
Hgb A1c MFr Bld: 5.6 % of total Hgb (ref <5.7)
eAG (mmol/L): 6.3 mmol/L

## 2023-02-19 LAB — ANTI-NUCLEAR AB-TITER (ANA TITER)
ANA TITER: 1:80 {titer} — ABNORMAL HIGH
ANA Titer 1: 1:80 {titer} — ABNORMAL HIGH

## 2023-02-19 LAB — ANA,IFA RA DIAG PNL W/RFLX TIT/PATN
Anti Nuclear Antibody (ANA): POSITIVE — AB
Cyclic Citrullin Peptide Ab: <16 UNITS
Rheumatoid fact SerPl-aCnc: <10 IU/mL (ref <14)

## 2023-02-19 LAB — TSH+FREE T4: TSH W/REFLEX TO FT4: 5.19 mIU/L — ABNORMAL HIGH

## 2023-02-19 LAB — T4, FREE: Free T4: 0.9 ng/dL (ref 0.8–1.8)

## 2023-02-19 MED ORDER — LEVOTHYROXINE SODIUM 50 MCG PO TABS: 50.0000 ug | ORAL_TABLET | Freq: Every day | ORAL | 0 refills | Status: DC

## 2023-02-23 DIAGNOSIS — R7989 Other specified abnormal findings of blood chemistry: Secondary | ICD-10-CM | POA: Insufficient documentation

## 2023-02-23 DIAGNOSIS — N939 Abnormal uterine and vaginal bleeding, unspecified: Secondary | ICD-10-CM | POA: Insufficient documentation

## 2023-02-23 DIAGNOSIS — E611 Iron deficiency: Secondary | ICD-10-CM | POA: Insufficient documentation

## 2023-02-23 DIAGNOSIS — M791 Myalgia, unspecified site: Secondary | ICD-10-CM | POA: Insufficient documentation

## 2023-02-23 NOTE — Assessment & Plan Note (Signed)
Heavy menstrual cycles.  She has been taking iron supplement.  Update iron panel.

## 2023-02-23 NOTE — Assessment & Plan Note (Signed)
Poss related to thyroid abnormality.  Checking thyroid function and we will obtain pelvic ultrasound.

## 2023-02-23 NOTE — Assessment & Plan Note (Signed)
Checking TSH and free T4.  She did have abnormal TPO antibodies and likely has Hashimoto's.

## 2023-02-23 NOTE — Assessment & Plan Note (Signed)
Checking CK and autoimmune markers.

## 2023-02-25 ENCOUNTER — Other Ambulatory Visit: Payer: Self-pay | Admitting: Family Medicine

## 2023-03-04 ENCOUNTER — Other Ambulatory Visit: Payer: Self-pay | Admitting: Family Medicine

## 2023-03-04 DIAGNOSIS — R768 Other specified abnormal immunological findings in serum: Secondary | ICD-10-CM

## 2023-03-14 ENCOUNTER — Other Ambulatory Visit: Payer: Self-pay | Admitting: Family Medicine

## 2023-03-24 ENCOUNTER — Telehealth: Payer: Self-pay | Admitting: General Practice

## 2023-03-24 NOTE — Transitions of Care (Post Inpatient/ED Visit) (Signed)
   03/24/2023  Name: Debbie Gallegos MRN: [id number] DOB: 1986-02-15  Today's TOC FU Call Status: Today's TOC FU Call Status:: Successful TOC FU Call Completed TOC FU Call Complete Date: 03/24/23  Transition Care Management Follow-up Telephone Call Date of Discharge: 03/22/23 Discharge Facility: Other ([facility]) Name of Other ([facility] Facility: [facility] Type of Discharge: Emergency Department Reason for ED Visit: Respiratory, Other: (flank pain, vomiting) Respiratory Diagnosis: Pnuemonia How have you been since you were released from the hospital?: Better Any questions or concerns?: No  Items Reviewed: Did you receive and understand the discharge instructions provided?: Yes Medications obtained,verified, and reconciled?: Yes (Medications Reviewed) Any new allergies since your discharge?: No Dietary orders reviewed?: NA Do you have support at home?: Yes  Medications Reviewed Today: Medications Reviewed Today   Medications were not reviewed in this encounter     Home Care and Equipment/Supplies: Were Home Health Services Ordered?: NA Any new equipment or medical supplies ordered?: NA  Functional Questionnaire: Do you need assistance with bathing/showering or dressing?: No Do you need assistance with meal preparation?: No Do you need assistance with eating?: No Do you have difficulty maintaining continence: No Do you need assistance with getting out of bed/getting out of a chair/moving?: No Do you have difficulty managing or taking your medications?: No  Follow up appointments reviewed: PCP Follow-up appointment confirmed?: Yes Date of PCP follow-up appointment?: 03/31/23 Follow-up Provider: Dr. Ashley Royalty Specialist [facility] Follow-up appointment confirmed?: NA Do you need transportation to your follow-up appointment?: No Do you understand care options if your condition(s) worsen?: Yes-patient verbalized understanding  SDOH Interventions  Today    Flowsheet Row Most Recent Value  SDOH Interventions   Transportation Interventions Intervention Not Indicated       SIGNATURE Modesto Charon, RN BSN Nurse Health Advisor

## 2023-03-31 ENCOUNTER — Other Ambulatory Visit: Payer: Self-pay

## 2023-03-31 ENCOUNTER — Ambulatory Visit (INDEPENDENT_AMBULATORY_CARE_PROVIDER_SITE_OTHER): Payer: Managed Care, Other (non HMO)

## 2023-03-31 ENCOUNTER — Ambulatory Visit: Payer: Managed Care, Other (non HMO) | Admitting: Family Medicine

## 2023-03-31 ENCOUNTER — Encounter: Payer: Self-pay | Admitting: Family Medicine

## 2023-03-31 VITALS — BP 138/88 | HR 70 | Ht 65.5 in | Wt 189.2 lb

## 2023-03-31 DIAGNOSIS — J189 Pneumonia, unspecified organism: Secondary | ICD-10-CM | POA: Diagnosis not present

## 2023-03-31 DIAGNOSIS — R0781 Pleurodynia: Secondary | ICD-10-CM

## 2023-03-31 DIAGNOSIS — E039 Hypothyroidism, unspecified: Secondary | ICD-10-CM

## 2023-03-31 MED ORDER — METFORMIN HCL 500 MG PO TABS: ORAL_TABLET | ORAL | 0 refills | Status: DC

## 2023-03-31 NOTE — Assessment & Plan Note (Signed)
Will plan to repeat thyroid function tests in a few weeks as her recent illness may affect levels.

## 2023-03-31 NOTE — Progress Notes (Signed)
Debbie Gallegos - 37 y.o. female MRN [id number]  Date of birth: 1986-07-10  Subjective Chief Complaint  Patient presents with   Hospitalization Follow-up    Patient was seen at [facility] in [place], [place]-  x Saturday 03/29/23- still having left flank pain and dyspnea with movement. Was DX: left lower lobe pneumonia. Took Toradol 10mg  last night and helped the pain. Finished Augmentin given by hospital.   thyroid follow up    Patient currently taking levothyroxine.     HPI Debbie Gallegos is a 37 y.o. 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  She was seen about 2 weeks ago in ED around [facility] and diagnosed with pneumonia.  She was started on Augmentin.  Really her only symptom was left-sided rib/chest pain.  She she did complete Augmentin but reports that she continues to have pain in the left rib area.  Pain is worse with taking a deep breath.  Denies cough, fever or chills.  Continues on 50 mcg of levothyroxine.  Hard to tell if she has noticed any significant difference due to her current illness.  She did have significant elevated ANA as well and was seen by rheumatology but felt that this was related to cross-reactivity from her Hashimoto's.  ROS:  A comprehensive ROS was completed and negative except as noted per HPI  Allergies  Allergen Reactions   Phenergan [Promethazine Hcl] Other (See Comments)    Lock jaw, neck stiffness    Past Medical History:  Diagnosis Date   Family history of breast cancer    Family history of kidney cancer    Family history of melanoma    Family history of prostate cancer    PCOS (polycystic ovarian syndrome)     Past Surgical History:  Procedure Laterality Date   CESAREAN SECTION     CHOLECYSTECTOMY, LAPAROSCOPIC     TUBAL LIGATION      Social History   Socioeconomic History   Marital status: Married    Spouse name: Debbie Gallegos   Number of  children: 2   Years of education: Not on file   Highest education level: 12th grade  Occupational History   Occupation: Stay At Home Mother  Tobacco Use   Smoking status: Former    Current packs/day: 0.00    Types: Cigarettes    Quit date: 10/11/2015    Years since quitting: 7.4   Smokeless tobacco: Never  Vaping Use   Vaping status: Never Used  Substance and Sexual Activity   Alcohol use: Not Currently   Drug use: Never   Sexual activity: Yes    Partners: Male    Birth control/protection: None    Comment: Tubal Ligation  Other Topics Concern   Not on file  Social History Narrative   Not on file   Social Determinants of Health   Financial Resource Strain: Low Risk  (02/17/2023)   Overall Financial Resource Strain (CARDIA)    Difficulty of Paying Living Expenses: Not hard at all  Food Insecurity: Low Risk  (03/07/2023)   Received from [facility]   Food vital sign    Within the past 12 months, you worried that your food would run out before you got money to buy more: Never true    Within the past 12 months, the food you bought just didn't last and you didn't have money to get more. : Never true  Transportation Needs: No Transportation Needs (03/24/2023)  PRAPARE - Administrator, Civil Service (Medical): No    Lack of Transportation (Non-Medical): No  Physical Activity: Insufficiently Active (02/17/2023)   Exercise Vital Sign    Days of Exercise per Week: 3 days    Minutes of Exercise per Session: 30 min  Stress: No Stress Concern Present (02/17/2023)   [employer] of Occupational Health - Occupational Stress Questionnaire    Feeling of Stress : Not at all  Social Connections: Moderately Isolated (02/17/2023)   Social Connection and Isolation Panel [NHANES]    Frequency of Communication with Friends and Family: More than three times a week    Frequency of Social Gatherings with Friends and Family: Once a week    Attends Religious Services: Never    Automotive engineer or Organizations: No    Attends Engineer, structural: Not on file    Marital Status: Married    Family History  Problem Relation Age of Onset   Hypertension Mother    Diabetes Mother    Stroke Mother    Breast cancer Mother 28       HER2+   Melanoma Father        ? due to agent orange   Other Father        cancerous nasal polyps   Heart disease Maternal Grandmother    Kidney cancer Maternal Grandfather 83       encapsulated   Breast cancer Maternal Aunt 54   Prostate cancer Paternal Grandfather    Uterine cancer Other 41       MGF's mother    Health Maintenance  Topic Date Due   INFLUENZA VACCINE  03/13/2023   Hepatitis C Screening  07/17/2023 (Originally 08/16/2001)   HIV Screening  07/17/2023 (Originally 08/16/1998)   COVID-19 Vaccine (3 - 2023-24 season) 10/05/2023 (Originally 04/12/2022)   DTaP/Tdap/Td (2 - Td or Tdap) 08/12/2025   PAP SMEAR-Modifier  08/31/2026   HPV VACCINES  Aged Out     ----------------------------------------------------------------------------------------------------------------------------------------------------------------------------------------------------------------- Physical Exam BP 138/88   Pulse 70   Ht 5' 5.5" (1.664 m)   Wt 189 lb 4 oz (85.8 kg)   SpO2 100%   BMI 31.01 kg/m   Physical Exam Constitutional:      Appearance: Normal appearance.  HENT:     Head: Normocephalic and atraumatic.  Eyes:     General: No scleral icterus. Cardiovascular:     Rate and Rhythm: Normal rate and regular rhythm.  Pulmonary:     Effort: Pulmonary effort is normal.     Breath sounds: Normal breath sounds.  Musculoskeletal:     Cervical back: Neck supple.  Neurological:     Mental Status: She is alert.  Psychiatric:        Mood and Affect: Mood normal.        Behavior: Behavior normal.      ------------------------------------------------------------------------------------------------------------------------------------------------------------------------------------------------------------------- Assessment and Plan  Hypothyroidism Will plan to repeat thyroid function tests in a few weeks as her recent illness may affect levels.  Pneumonia of left lower lobe due to infectious organism She is still having left rib pain.  Updated chest x-ray ordered.   No orders of the defined types were placed in this encounter.   No follow-ups on file.    This visit occurred during the SARS-CoV-2 public health emergency.  Safety protocols were in place, including screening questions prior to the visit, additional usage of staff PPE, and extensive cleaning of exam room while observing appropriate contact time  as indicated for disinfecting solutions.

## 2023-03-31 NOTE — Assessment & Plan Note (Signed)
She is still having left rib pain.  Updated chest x-ray ordered.

## 2023-04-01 ENCOUNTER — Other Ambulatory Visit: Payer: Self-pay | Admitting: Family Medicine

## 2023-04-01 ENCOUNTER — Encounter: Payer: Self-pay | Admitting: Family Medicine

## 2023-04-01 DIAGNOSIS — R0781 Pleurodynia: Secondary | ICD-10-CM

## 2023-04-01 LAB — D-DIMER, QUANTITATIVE: D-DIMER: 0.39 mg{FEU}/L (ref 0.00–0.49)

## 2023-04-03 ENCOUNTER — Other Ambulatory Visit: Payer: Self-pay | Admitting: Family Medicine

## 2023-04-03 DIAGNOSIS — R0789 Other chest pain: Secondary | ICD-10-CM

## 2023-04-03 DIAGNOSIS — J189 Pneumonia, unspecified organism: Secondary | ICD-10-CM

## 2023-04-03 MED ORDER — TIZANIDINE HCL 4 MG PO TABS: 4.0000 mg | ORAL_TABLET | Freq: Four times a day (QID) | ORAL | 0 refills | Status: DC | PRN

## 2023-04-04 ENCOUNTER — Telehealth: Payer: Self-pay | Admitting: Family Medicine

## 2023-04-04 ENCOUNTER — Other Ambulatory Visit: Payer: Managed Care, Other (non HMO)

## 2023-04-04 NOTE — Telephone Encounter (Signed)
Pt called back with Authorization #[id number] for CT

## 2023-04-04 NOTE — Telephone Encounter (Signed)
[employer] [facility] called in for the patient sated they have received a PA for CT Scan. Stating they are need Clinical documentation and Medical records. It needs to be sent to a working fax number in our office. Update and resend fax request including the Pt name and DOB on each page.   [employer] [facility] Fax: [phone]

## 2023-04-08 ENCOUNTER — Telehealth: Payer: Self-pay

## 2023-04-08 NOTE — Transitions of Care (Post Inpatient/ED Visit) (Signed)
   04/08/2023  Name: Debbie Gallegos MRN: [id number] DOB: [birth year]  Today's TOC FU Call Status: Today's TOC FU Call Status:: Successful TOC FU Call Completed TOC FU Call Complete Date: 04/08/23 Patient's Name and Date of Birth confirmed.  Transition Care Management Follow-up Telephone Call Date of Discharge: 04/07/23 Discharge Facility: Other [facility]) Name of Other ([facility]) Discharge Facility: [facility] Type of Discharge: Inpatient Admission Primary Inpatient Discharge Diagnosis:: abd pain How have you been since you were released from the hospital?: Better Any questions or concerns?: No  Items Reviewed: Did you receive and understand the discharge instructions provided?: Yes Medications obtained,verified, and reconciled?: Yes (Medications Reviewed) Any new allergies since your discharge?: No Dietary orders reviewed?: Yes Do you have support at home?: Yes People in Home: parent(s)  Medications Reviewed Today: Medications Reviewed Today     Reviewed by Karena Addison, LPN (Licensed Practical Nurse) on 04/08/23 at 1009  Med List Status: <None>   Medication Order Taking? Sig Documenting Provider Last Dose Status Informant  amoxicillin-clavulanate (AUGMENTIN) 875-125 MG tablet [id number] No Take 1 tablet by mouth 2 (two) times daily.  Patient not taking: Reported on 03/31/2023   [provider] Not Taking Active   cetirizine (ZYRTEC) 10 MG tablet [id number] No Take 1 tablet (10 mg total) by mouth daily. Everrett Coombe, DO Taking Active   fluticasone (FLONASE) 50 MCG/ACT nasal spray [id number] No Place 2 sprays into both nostrils daily.  Patient not taking: Reported on 03/31/2023   Everrett Coombe, DO Not Taking Active   ketorolac (TORADOL) 10 MG tablet [id number] No Take 10 mg by mouth every 6 (six) hours as needed. [provider] Taking Active   levothyroxine (SYNTHROID) 50 MCG tablet [id number] No Take 1 tablet by mouth once daily Everrett Coombe, DO Taking  Active   metFORMIN (GLUCOPHAGE) 500 MG tablet [id number]  TAKE 2 TABLETS BY MOUTH IN THE MORNING AND TAKE 1 TABLET BY MOUTH IN THE EVENING FOR  PCOS Everrett Coombe, DO  Active   ondansetron (ZOFRAN-ODT) 4 MG disintegrating tablet [id number] No Take 12 mg by mouth 3 (three) times daily. [provider] Taking Active   tiZANidine (ZANAFLEX) 4 MG tablet [id number]  Take 1 tablet (4 mg total) by mouth every 6 (six) hours as needed for muscle spasms. Everrett Coombe, DO  Active             Home Care and Equipment/Supplies: Were Home Health Services Ordered?: NA Any new equipment or medical supplies ordered?: NA  Functional Questionnaire: Do you need assistance with bathing/showering or dressing?: No Do you need assistance with meal preparation?: No Do you need assistance with eating?: No Do you have difficulty maintaining continence: No Do you need assistance with getting out of bed/getting out of a chair/moving?: No Do you have difficulty managing or taking your medications?: No  Follow up appointments reviewed: PCP Follow-up appointment confirmed?: Yes Date of PCP follow-up appointment?: 04/16/23 Follow-up Provider: [facility] Follow-up appointment confirmed?: NA Do you need transportation to your follow-up appointment?: No Do you understand care options if your condition(s) worsen?: Yes-patient verbalized understanding    SIGNATURE Karena Addison, LPN [facility] Nurse Health Advisor Direct Dial [phone]

## 2023-04-09 ENCOUNTER — Encounter: Payer: Self-pay | Admitting: Family Medicine

## 2023-04-10 ENCOUNTER — Other Ambulatory Visit: Payer: Self-pay | Admitting: Family Medicine

## 2023-04-10 MED ORDER — NIRMATRELVIR/RITONAVIR (PAXLOVID)TABLET: 3.0000 | ORAL_TABLET | Freq: Two times a day (BID) | ORAL | 0 refills | Status: AC

## 2023-04-10 NOTE — Telephone Encounter (Signed)
Auth obtained, ref # [id number]. Case # [id number]. Valid from 04/03/23 through 09/30/23. The imaging dept was been notified on 04/07/23 to contact patient for scheduling.

## 2023-04-16 ENCOUNTER — Ambulatory Visit: Payer: Managed Care, Other (non HMO) | Admitting: Family Medicine

## 2023-04-16 ENCOUNTER — Encounter: Payer: Self-pay | Admitting: Family Medicine

## 2023-04-16 VITALS — BP 118/82 | HR 107 | Ht 65.5 in | Wt 188.0 lb

## 2023-04-16 DIAGNOSIS — R091 Pleurisy: Secondary | ICD-10-CM

## 2023-04-16 NOTE — Progress Notes (Signed)
Debbie Gallegos - 37 y.o. female MRN [id number]  Date of birth: [birth year]  Subjective Chief Complaint  Patient presents with   Hospitalization Follow-up    HPI Debbie Gallegos is a 37 year old female here today for follow-up of recent ER visit.  She had pneumonia previously with increasing left-sided chest pain.  Repeat x-ray did not show pneumonia.  We did order a CT scan for further evaluation given her increased pain.  She had quite a bit of pain and ended up going to the ER before we were able to get her CT approved through her insurance as they required 72 hours to review submitted material.  In the ED she did have CT angio pulmonary which ruled out continued pneumonia and/or PE.  She was diagnosed with pleuritis and sent home with steroids, anti-inflammatories and Lidoderm patches.  Today she reports she is feeling much better.  She is able to take a deep breath much easier.  Denies new cough, fever or chills.  She did recently test positive for COVID after exposure.  This was treated with Paxlovid.  There are no residual symptoms from this.  ROS:  A comprehensive ROS was completed and negative except as noted per HPI  Allergies  Allergen Reactions   Phenergan [Promethazine Hcl] Other (See Comments)    Lock jaw, neck stiffness    Past Medical History:  Diagnosis Date   Family history of breast cancer    Family history of kidney cancer    Family history of melanoma    Family history of prostate cancer    PCOS (polycystic ovarian syndrome)     Past Surgical History:  Procedure Laterality Date   CESAREAN SECTION     CHOLECYSTECTOMY, LAPAROSCOPIC     TUBAL LIGATION      Social History   Socioeconomic History   Marital status: Married    Spouse name: Kaliegh Arment   Number of children: 2   Years of education: Not on file   Highest education level: 12th grade  Occupational History   Occupation: Stay At Home Mother  Tobacco Use   Smoking status: Former    Current packs/day:  0.00    Types: Cigarettes    Quit date: 10/11/2015    Years since quitting: 7.5   Smokeless tobacco: Never  Vaping Use   Vaping status: Never Used  Substance and Sexual Activity   Alcohol use: Not Currently   Drug use: Never   Sexual activity: Yes    Partners: Male    Birth control/protection: None    Comment: Tubal Ligation  Other Topics Concern   Not on file  Social History Narrative   Not on file   Social Determinants of Health   Financial Resource Strain: Low Risk  (02/17/2023)   Overall Financial Resource Strain (CARDIA)    Difficulty of Paying Living Expenses: Not hard at all  Food Insecurity: Low Risk  (03/07/2023)   Received from [facility]   Hunger Vital Sign    Worried About Running Out of Food in the Last Year: Never true    Ran Out of Food in the Last Year: Never true  Transportation Needs: No Transportation Needs (03/24/2023)   PRAPARE - Administrator, Civil Service (Medical): No    Lack of Transportation (Non-Medical): No  Physical Activity: Insufficiently Active (02/17/2023)   Exercise Vital Sign    Days of Exercise per Week: 3 days    Minutes of Exercise per Session: 30 min  Stress: No  Stress Concern Present (02/17/2023)   [employer] of Occupational Health - Occupational Stress Questionnaire    Feeling of Stress : Not at all  Social Connections: Moderately Isolated (02/17/2023)   Social Connection and Isolation Panel [NHANES]    Frequency of Communication with Friends and Family: More than three times a week    Frequency of Social Gatherings with Friends and Family: Once a week    Attends Religious Services: Never    Database administrator or Organizations: No    Attends Engineer, structural: Not on file    Marital Status: Married    Family History  Problem Relation Age of Onset   Hypertension Mother    Diabetes Mother    Stroke Mother    Breast cancer Mother 66       HER2+   Melanoma Father        ? due to agent orange    Other Father        cancerous nasal polyps   Heart disease Maternal Grandmother    Kidney cancer Maternal Grandfather 83       encapsulated   Breast cancer Maternal Aunt 54   Prostate cancer Paternal Grandfather    Uterine cancer Other 70       MGF's mother    Health Maintenance  Topic Date Due   INFLUENZA VACCINE  Never done   COVID-19 Vaccine (3 - 2023-24 season) 04/13/2023   Hepatitis C Screening  07/17/2023 (Originally 08/16/2001)   HIV Screening  07/17/2023 (Originally 08/16/1998)   DTaP/Tdap/Td (2 - Td or Tdap) 08/12/2025   PAP SMEAR-Modifier  08/31/2026   HPV VACCINES  Aged Out     ----------------------------------------------------------------------------------------------------------------------------------------------------------------------------------------------------------------- Physical Exam BP 118/82 (BP Location: Left Arm, Patient Position: Sitting, Cuff Size: Large)   Pulse (!) 107   Ht 5' 5.5" (1.664 m)   Wt 188 lb (85.3 kg)   SpO2 97%   BMI 30.81 kg/m   Physical Exam Constitutional:      Appearance: Normal appearance.  HENT:     Head: Normocephalic and atraumatic.  Cardiovascular:     Rate and Rhythm: Normal rate and regular rhythm.  Pulmonary:     Effort: Pulmonary effort is normal.     Breath sounds: Normal breath sounds.  Musculoskeletal:     Cervical back: Neck supple.  Neurological:     Mental Status: She is alert.  Psychiatric:        Mood and Affect: Mood normal.        Behavior: Behavior normal.     ------------------------------------------------------------------------------------------------------------------------------------------------------------------------------------------------------------------- Assessment and Plan  Pleuritis Symptoms improved with steroids.  She does have Celebrex as needed as well.  She does have an incentive spirometer which I encouraged her to use.   No orders of the defined types were placed in  this encounter.   No follow-ups on file.    This visit occurred during the SARS-CoV-2 public health emergency.  Safety protocols were in place, including screening questions prior to the visit, additional usage of staff PPE, and extensive cleaning of exam room while observing appropriate contact time as indicated for disinfecting solutions.

## 2023-04-16 NOTE — Assessment & Plan Note (Signed)
Symptoms improved with steroids.  She does have Celebrex as needed as well.  She does have an incentive spirometer which I encouraged her to use.

## 2023-04-24 LAB — TSH+FREE T4
Free T4: 1.22 ng/dL (ref 0.82–1.77)
TSH: 2.89 u[IU]/mL (ref 0.450–4.500)

## 2023-04-25 MED ORDER — PHENTERMINE HCL 37.5 MG PO CAPS: 37.5000 mg | ORAL_CAPSULE | ORAL | 0 refills | Status: DC

## 2023-04-25 MED ORDER — LEVOTHYROXINE SODIUM 50 MCG PO TABS: 50.0000 ug | ORAL_TABLET | Freq: Every day | ORAL | 0 refills | Status: DC

## 2023-06-03 ENCOUNTER — Other Ambulatory Visit: Payer: Self-pay | Admitting: Family Medicine

## 2023-06-05 ENCOUNTER — Ambulatory Visit: Payer: Managed Care, Other (non HMO) | Admitting: Family Medicine

## 2023-06-05 ENCOUNTER — Encounter: Payer: Self-pay | Admitting: Family Medicine

## 2023-06-05 VITALS — BP 111/76 | HR 82 | Ht 65.5 in | Wt 184.8 lb

## 2023-06-05 DIAGNOSIS — J40 Bronchitis, not specified as acute or chronic: Secondary | ICD-10-CM | POA: Diagnosis not present

## 2023-06-05 DIAGNOSIS — R059 Cough, unspecified: Secondary | ICD-10-CM | POA: Diagnosis not present

## 2023-06-05 DIAGNOSIS — J329 Chronic sinusitis, unspecified: Secondary | ICD-10-CM

## 2023-06-05 LAB — POCT INFLUENZA A/B
Influenza A, POC: NEGATIVE
Influenza B, POC: NEGATIVE

## 2023-06-05 LAB — POC COVID19 BINAXNOW: SARS Coronavirus 2 Ag: NEGATIVE

## 2023-06-05 MED ORDER — PREDNISONE 20 MG PO TABS
20.0000 mg | ORAL_TABLET | Freq: Two times a day (BID) | ORAL | 0 refills | Status: AC
Start: 2023-06-05 — End: 2023-06-10

## 2023-06-05 MED ORDER — DOXYCYCLINE HYCLATE 100 MG PO TABS: 100.0000 mg | ORAL_TABLET | Freq: Two times a day (BID) | ORAL | 0 refills | Status: DC

## 2023-06-05 NOTE — Assessment & Plan Note (Signed)
Recommend continued supportive care. Adding course of doxycycline and burst of prednisone.  Contact clinic if symptoms are not improving/worsening.

## 2023-06-05 NOTE — Patient Instructions (Signed)

## 2023-06-05 NOTE — Progress Notes (Signed)
Debbie Gallegos - 37 y.o. female MRN [id number]  Date of birth: 1985-12-10  Subjective Chief Complaint  Patient presents with   Sinus Problem    Patient c/o sinus pressure, headache, cough that is causing chest discomfort x few days.    HPI Debbie Gallegos is a 37 y.o. female here today with complaint of congestion, sinus pain and pressure, and cough.  Symptoms started 3-4 days ago.  So far she has tried OTC cold and flu medication.  Some chills.  Unsure about fever.  Marland Kitchen  Her husband has similar symptoms.    ROS:  A comprehensive ROS was completed and negative except as noted per HPI  Allergies  Allergen Reactions   Phenergan [Promethazine Hcl] Other (See Comments)    Lock jaw, neck stiffness    Past Medical History:  Diagnosis Date   Family history of breast cancer    Family history of kidney cancer    Family history of melanoma    Family history of prostate cancer    PCOS (polycystic ovarian syndrome)     Past Surgical History:  Procedure Laterality Date   CESAREAN SECTION     CHOLECYSTECTOMY, LAPAROSCOPIC     TUBAL LIGATION      Social History   Socioeconomic History   Marital status: Married    Spouse name: Daleyssa Pulliam   Number of children: 2   Years of education: Not on file   Highest education level: 12th grade  Occupational History   Occupation: Stay At Home Mother  Tobacco Use   Smoking status: Former    Current packs/day: 0.00    Types: Cigarettes    Quit date: 10/11/2015    Years since quitting: 7.6   Smokeless tobacco: Never  Vaping Use   Vaping status: Never Used  Substance and Sexual Activity   Alcohol use: Not Currently   Drug use: Never   Sexual activity: Yes    Partners: Male    Birth control/protection: None    Comment: Tubal Ligation  Other Topics Concern   Not on file  Social History Narrative   Not on file   Social Determinants of Health   Financial Resource Strain: Low Risk  (02/17/2023)   Overall Financial Resource Strain (CARDIA)     Difficulty of Paying Living Expenses: Not hard at all  Food Insecurity: Low Risk  (03/07/2023)   Received from [facility]   Hunger Vital Sign    Worried About Running Out of Food in the Last Year: Never true    Ran Out of Food in the Last Year: Never true  Transportation Needs: No Transportation Needs (03/24/2023)   PRAPARE - Administrator, Civil Service (Medical): No    Lack of Transportation (Non-Medical): No  Physical Activity: Insufficiently Active (02/17/2023)   Exercise Vital Sign    Days of Exercise per Week: 3 days    Minutes of Exercise per Session: 30 min  Stress: No Stress Concern Present (02/17/2023)   [employer] of Occupational Health - Occupational Stress Questionnaire    Feeling of Stress : Not at all  Social Connections: Moderately Isolated (02/17/2023)   Social Connection and Isolation Panel [NHANES]    Frequency of Communication with Friends and Family: More than three times a week    Frequency of Social Gatherings with Friends and Family: Once a week    Attends Religious Services: Never    Database administrator or Organizations: No    Attends Banker Meetings:  Not on file    Marital Status: Married    Family History  Problem Relation Age of Onset   Hypertension Mother    Diabetes Mother    Stroke Mother    Breast cancer Mother 78       HER2+   Melanoma Father        ? due to agent orange   Other Father        cancerous nasal polyps   Heart disease Maternal Grandmother    Kidney cancer Maternal Grandfather 64       encapsulated   Breast cancer Maternal Aunt 54   Prostate cancer Paternal Grandfather    Uterine cancer Other 77       MGF's mother    Health Maintenance  Topic Date Due   COVID-19 Vaccine (3 - 2023-24 season) 06/21/2023 (Originally 04/13/2023)   Hepatitis C Screening  07/17/2023 (Originally 08/16/2001)   HIV Screening  07/17/2023 (Originally 08/16/1998)   INFLUENZA VACCINE  11/10/2023 (Originally 03/13/2023)    DTaP/Tdap/Td (2 - Td or Tdap) 08/12/2025   Cervical Cancer Screening (HPV/Pap Cotest)  08/31/2026   HPV VACCINES  Aged Out     ----------------------------------------------------------------------------------------------------------------------------------------------------------------------------------------------------------------- Physical Exam BP 111/76   Pulse 82   Ht 5' 5.5" (1.664 m)   Wt 184 lb 12 oz (83.8 kg)   SpO2 98%   BMI 30.28 kg/m   Physical Exam Constitutional:      Appearance: Normal appearance.  HENT:     Head: Normocephalic and atraumatic.  Eyes:     General: No scleral icterus. Cardiovascular:     Rate and Rhythm: Normal rate and regular rhythm.  Pulmonary:     Effort: Pulmonary effort is normal.     Breath sounds: Normal breath sounds.  Musculoskeletal:     Cervical back: Neck supple.  Neurological:     General: No focal deficit present.     Mental Status: She is alert.  Psychiatric:        Mood and Affect: Mood normal.        Behavior: Behavior normal.     ------------------------------------------------------------------------------------------------------------------------------------------------------------------------------------------------------------------- Assessment and Plan  Sinobronchitis Recommend continued supportive care. Adding course of doxycycline and burst of prednisone.  Contact clinic if symptoms are not improving/worsening.    Meds ordered this encounter  Medications   doxycycline (VIBRA-TABS) 100 MG tablet    Sig: Take 1 tablet (100 mg total) by mouth 2 (two) times daily.    Dispense:  20 tablet    Refill:  0   predniSONE (DELTASONE) 20 MG tablet    Sig: Take 1 tablet (20 mg total) by mouth 2 (two) times daily with a meal for 5 days.    Dispense:  10 tablet    Refill:  0    No follow-ups on file.    This visit occurred during the SARS-CoV-2 public health emergency.  Safety protocols were in place, including  screening questions prior to the visit, additional usage of staff PPE, and extensive cleaning of exam room while observing appropriate contact time as indicated for disinfecting solutions.

## 2023-06-29 ENCOUNTER — Other Ambulatory Visit: Payer: Self-pay | Admitting: Family Medicine

## 2023-07-02 IMAGING — DX DG KNEE COMPLETE 4+V*R*
4 series · 4 of 4 positions shown · non-contrast
Comparison: None

CLINICAL DATA: Acute RIGHT knee pain, pain with flexion, no known
injury

EXAM:
RIGHT KNEE - COMPLETE 4+ VIEW

[knee ap]
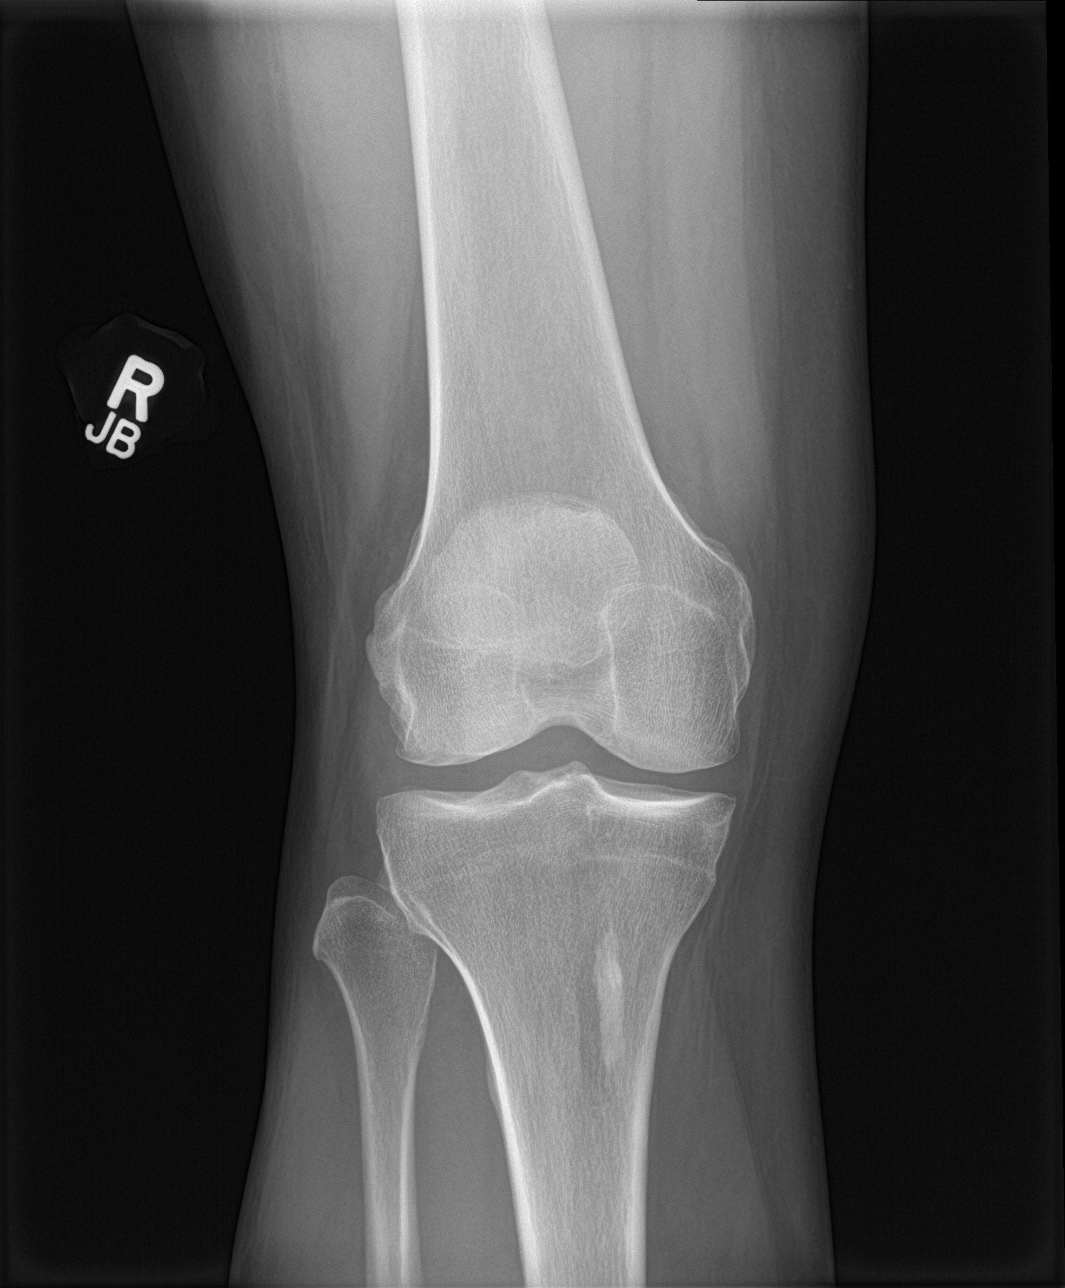

[knee lat]
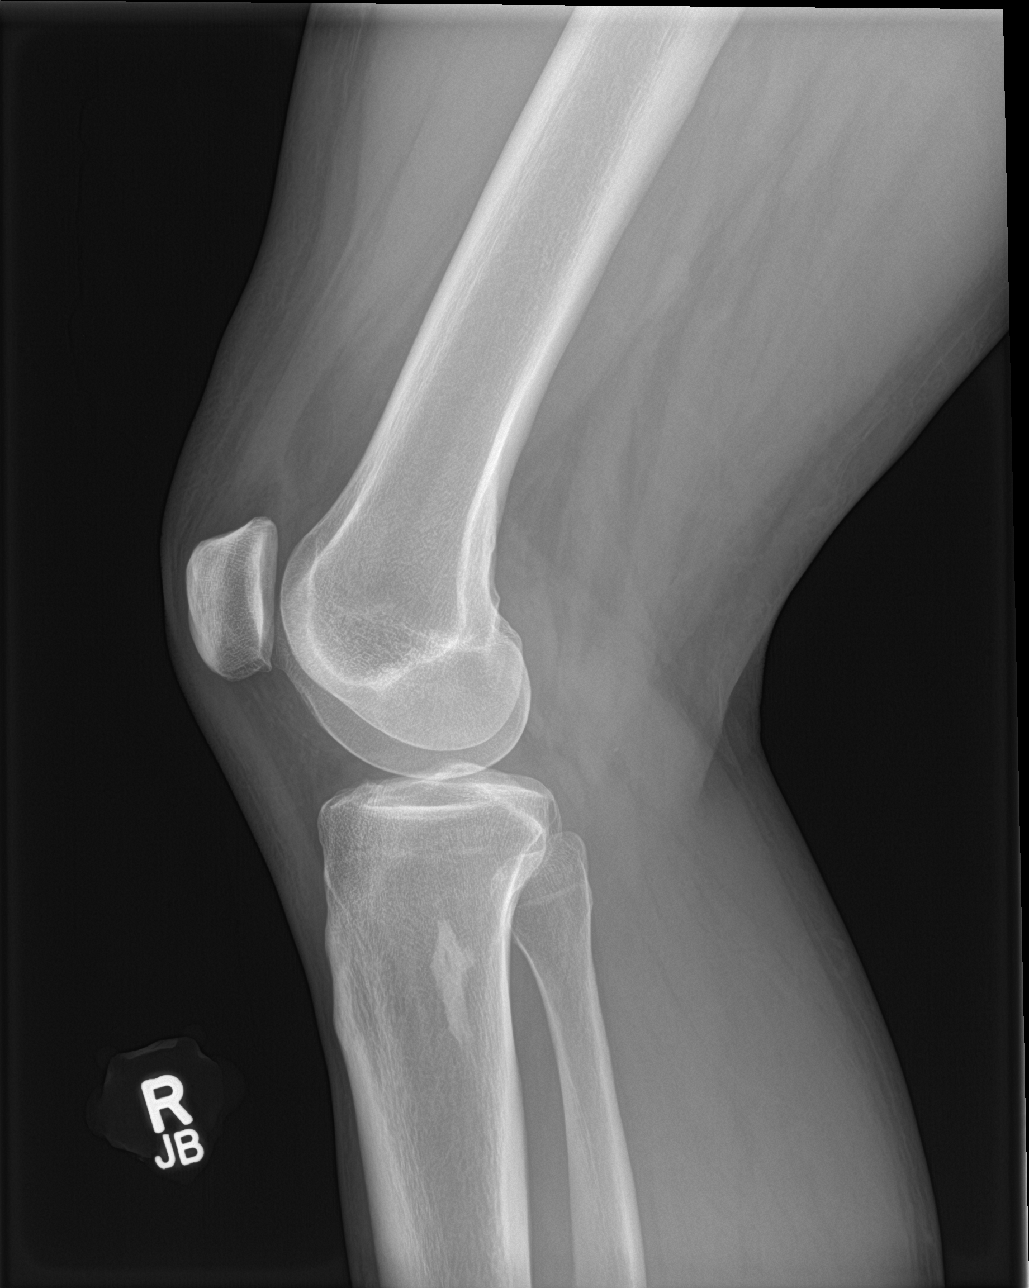

[knee obl (1 of 2)]
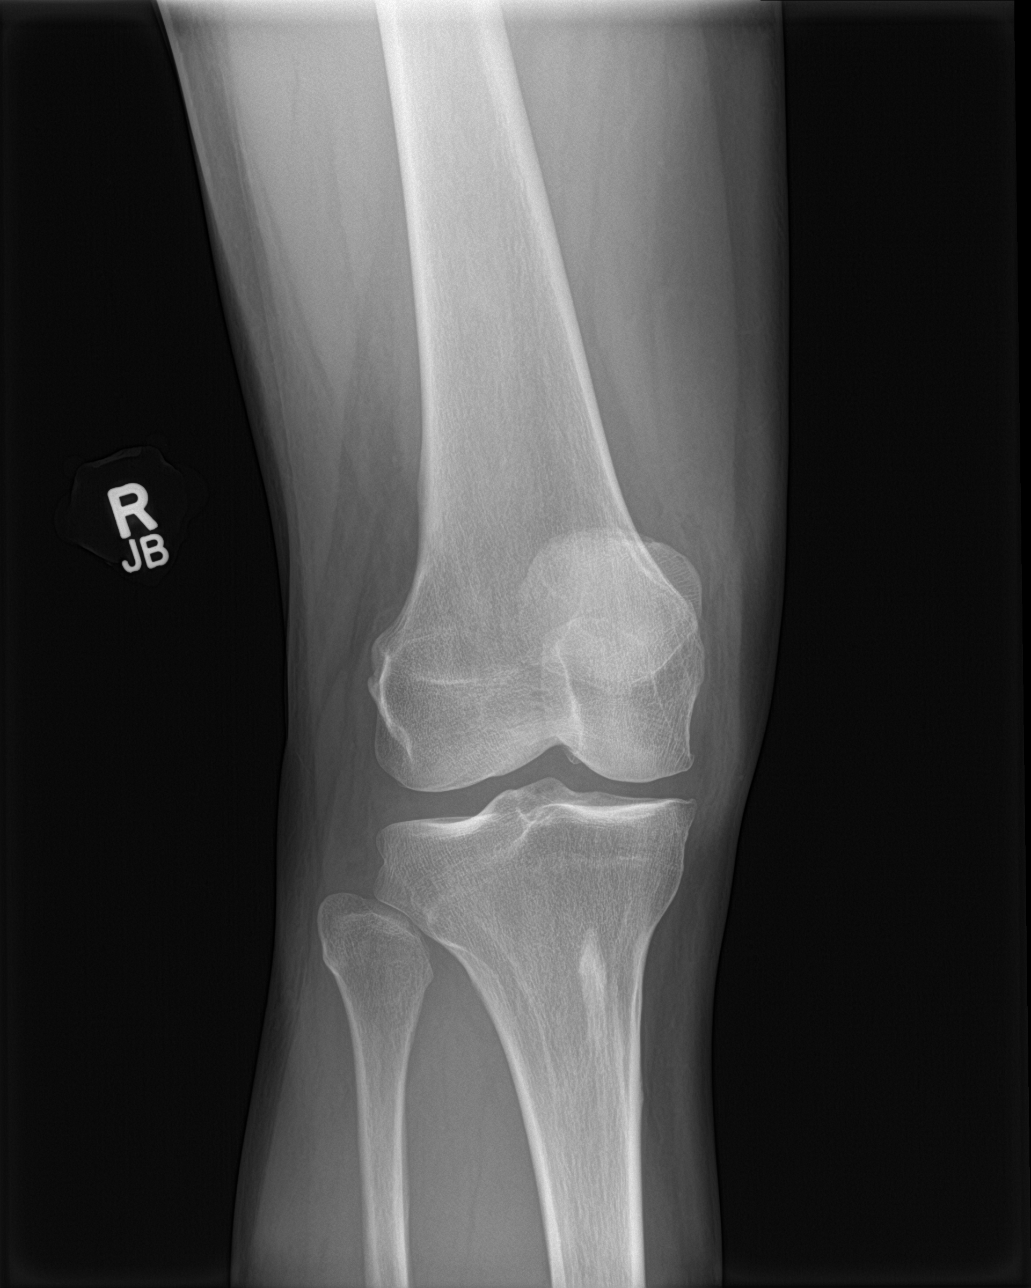

[knee obl (2 of 2)]
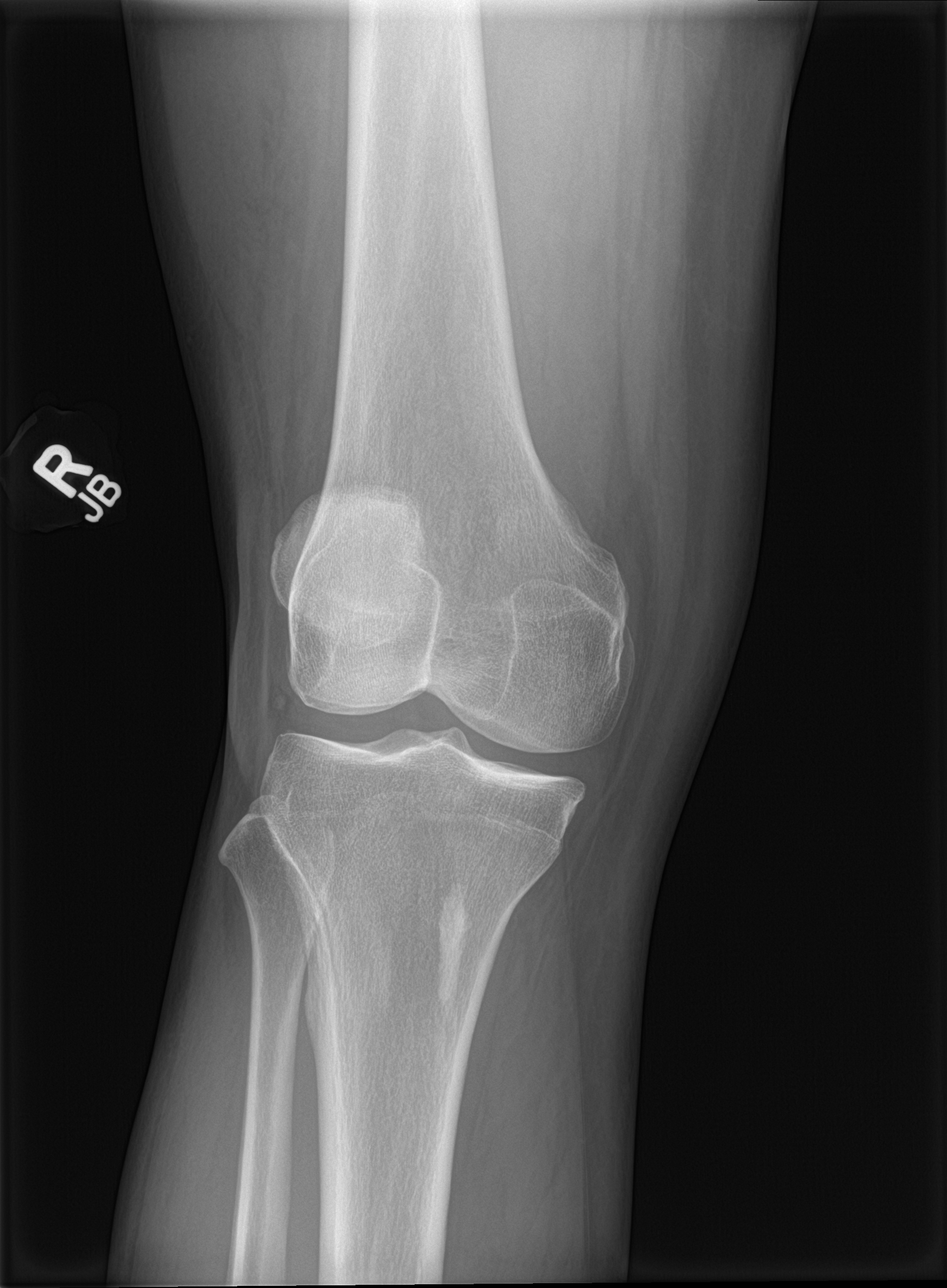

[4 of 4 positions shown; findings below may reference images not displayed]

FINDINGS: Osseous mineralization normal.

Joint spaces preserved.

Benign appearing sclerosis in proximal RIGHT tibial metaphysis.

No acute fracture, dislocation, or bone destruction.

No joint effusion.
IMPRESSION: No acute abnormalities.

## 2023-07-16 ENCOUNTER — Encounter: Payer: Managed Care, Other (non HMO) | Admitting: Internal Medicine

## 2023-07-21 ENCOUNTER — Other Ambulatory Visit: Payer: Self-pay | Admitting: Family Medicine

## 2023-08-03 ENCOUNTER — Other Ambulatory Visit: Payer: Self-pay | Admitting: Family Medicine

## 2023-08-21 ENCOUNTER — Other Ambulatory Visit: Payer: Self-pay | Admitting: Family Medicine

## 2023-08-31 ENCOUNTER — Other Ambulatory Visit: Payer: Self-pay | Admitting: Family Medicine

## 2023-09-03 ENCOUNTER — Other Ambulatory Visit: Payer: Self-pay | Admitting: Family Medicine

## 2023-09-03 MED ORDER — OSELTAMIVIR PHOSPHATE 75 MG PO CAPS: 75.0000 mg | ORAL_CAPSULE | Freq: Every day | ORAL | 0 refills | Status: AC

## 2023-09-19 ENCOUNTER — Other Ambulatory Visit: Payer: Self-pay | Admitting: Family Medicine

## 2023-10-02 ENCOUNTER — Encounter: Payer: Self-pay | Admitting: Family Medicine

## 2023-10-02 ENCOUNTER — Ambulatory Visit: Payer: Managed Care, Other (non HMO) | Admitting: Family Medicine

## 2023-10-02 ENCOUNTER — Ambulatory Visit (INDEPENDENT_AMBULATORY_CARE_PROVIDER_SITE_OTHER): Payer: Managed Care, Other (non HMO)

## 2023-10-02 VITALS — BP 135/88 | HR 84 | Temp 98.0°F | Ht 65.5 in | Wt 181.0 lb

## 2023-10-02 DIAGNOSIS — R071 Chest pain on breathing: Secondary | ICD-10-CM

## 2023-10-02 DIAGNOSIS — J329 Chronic sinusitis, unspecified: Secondary | ICD-10-CM | POA: Diagnosis not present

## 2023-10-02 DIAGNOSIS — J40 Bronchitis, not specified as acute or chronic: Secondary | ICD-10-CM

## 2023-10-02 MED ORDER — CEFDINIR 300 MG PO CAPS
300.0000 mg | ORAL_CAPSULE | Freq: Two times a day (BID) | ORAL | 0 refills | Status: DC
Start: 2023-10-02 — End: 2023-11-19

## 2023-10-02 MED ORDER — PREDNISONE 20 MG PO TABS: 20.0000 mg | ORAL_TABLET | Freq: Two times a day (BID) | ORAL | 0 refills | Status: AC

## 2023-10-02 NOTE — Progress Notes (Signed)
 Debbie Gallegos - 38 y.o. female MRN [id number]  Date of birth: 10-12-1985  Subjective Chief Complaint  Patient presents with   Pneumonia Symptoms    HPI Debbie Gallegos is a 38 y.o. female here today with complaint of cough and congestion.  She is having some pain with deep inspiration as well.  History of pleuritis seems to be good now the night when you think this is Jens Som it in the past.  Symptoms feel similar.  Cough is not really productive at this time.  She is having some sinus pain and pressure.  She denies fever or chills.  ROS:  A comprehensive ROS was completed and negative except as noted per HPI  Allergies  Allergen Reactions   Phenergan [Promethazine Hcl] Other (See Comments)    Lock jaw, neck stiffness    Past Medical History:  Diagnosis Date   Family history of breast cancer    Family history of kidney cancer    Family history of melanoma    Family history of prostate cancer    PCOS (polycystic ovarian syndrome)     Past Surgical History:  Procedure Laterality Date   CESAREAN SECTION     CHOLECYSTECTOMY, LAPAROSCOPIC     TUBAL LIGATION      Social History   Socioeconomic History   Marital status: Married    Spouse name: Maryanne Huneycutt   Number of children: 2   Years of education: Not on file   Highest education level: 12th grade  Occupational History   Occupation: Stay At Home Mother  Tobacco Use   Smoking status: Former    Current packs/day: 0.00    Types: Cigarettes    Quit date: 10/11/2015    Years since quitting: 7.9   Smokeless tobacco: Never  Vaping Use   Vaping status: Never Used  Substance and Sexual Activity   Alcohol use: Not Currently   Drug use: Never   Sexual activity: Yes    Partners: Male    Birth control/protection: None    Comment: Tubal Ligation  Other Topics Concern   Not on file  Social History Narrative   Not on file   Social Drivers of Health   Financial Resource Strain: Low Risk  (02/17/2023)   Overall Financial  Resource Strain (CARDIA)    Difficulty of Paying Living Expenses: Not hard at all  Food Insecurity: Low Risk  (03/07/2023)   Received from [facility]   Hunger Vital Sign    Worried About Running Out of Food in the Last Year: Never true    Ran Out of Food in the Last Year: Never true  Transportation Needs: No Transportation Needs (03/24/2023)   PRAPARE - Administrator, Civil Service (Medical): No    Lack of Transportation (Non-Medical): No  Physical Activity: Insufficiently Active (02/17/2023)   Exercise Vital Sign    Days of Exercise per Week: 3 days    Minutes of Exercise per Session: 30 min  Stress: No Stress Concern Present (02/17/2023)   [employer] of Occupational Health - Occupational Stress Questionnaire    Feeling of Stress : Not at all  Social Connections: Moderately Isolated (02/17/2023)   Social Connection and Isolation Panel [NHANES]    Frequency of Communication with Friends and Family: More than three times a week    Frequency of Social Gatherings with Friends and Family: Once a week    Attends Religious Services: Never    Database administrator or Organizations: No  Attends Banker Meetings: Not on file    Marital Status: Married    Family History  Problem Relation Age of Onset   Hypertension Mother    Diabetes Mother    Stroke Mother    Breast cancer Mother 63       HER2+   Melanoma Father        ? due to agent orange   Other Father        cancerous nasal polyps   Heart disease Maternal Grandmother    Kidney cancer Maternal Grandfather 60       encapsulated   Breast cancer Maternal Aunt 54   Prostate cancer Paternal Grandfather    Uterine cancer Other 35       MGF's mother    Health Maintenance  Topic Date Due   HIV Screening  Never done   Hepatitis C Screening  Never done   COVID-19 Vaccine (3 - 2024-25 season) 04/13/2023   INFLUENZA VACCINE  11/10/2023 (Originally 03/13/2023)   DTaP/Tdap/Td (2 - Td or Tdap) 08/12/2025    Cervical Cancer Screening (HPV/Pap Cotest)  08/31/2026   HPV VACCINES  Aged Out     ----------------------------------------------------------------------------------------------------------------------------------------------------------------------------------------------------------------- Physical Exam BP 135/88 (BP Location: Left Arm, Patient Position: Sitting, Cuff Size: Large)   Pulse 84   Temp 98 F (36.7 C) (Oral)   Ht 5' 5.5" (1.664 m)   Wt 181 lb (82.1 kg)   SpO2 100%   BMI 29.66 kg/m   Physical Exam Constitutional:      Appearance: Normal appearance.  HENT:     Head: Normocephalic and atraumatic.  Eyes:     General: No scleral icterus. Cardiovascular:     Rate and Rhythm: Normal rate and regular rhythm.  Pulmonary:     Effort: Pulmonary effort is normal.     Breath sounds: Normal breath sounds.  Musculoskeletal:     Cervical back: Neck supple.  Neurological:     Mental Status: She is alert.  Psychiatric:        Mood and Affect: Mood normal.        Behavior: Behavior normal.     ------------------------------------------------------------------------------------------------------------------------------------------------------------------------------------------------------------------- Assessment and Plan  Chest pain varying with breathing Chest x-ray ordered.  Sinobronchitis Treating with Omnicef and burst of prednisone.  Recommend supportive care as well with increase fluids and humidifier.  Red flags reviewed.   Meds ordered this encounter  Medications   cefdinir (OMNICEF) 300 MG capsule    Sig: Take 1 capsule (300 mg total) by mouth 2 (two) times daily.    Dispense:  20 capsule    Refill:  0   predniSONE (DELTASONE) 20 MG tablet    Sig: Take 1 tablet (20 mg total) by mouth 2 (two) times daily with a meal for 5 days.    Dispense:  10 tablet    Refill:  0    No follow-ups on file.    This visit occurred during the SARS-CoV-2 public  health emergency.  Safety protocols were in place, including screening questions prior to the visit, additional usage of staff PPE, and extensive cleaning of exam room while observing appropriate contact time as indicated for disinfecting solutions.  Call yes to fever yes,

## 2023-10-02 NOTE — Assessment & Plan Note (Signed)
 Chest x ray ordered.

## 2023-10-02 NOTE — Assessment & Plan Note (Signed)
 Treating with Omnicef and burst of prednisone.  Recommend supportive care as well with increase fluids and humidifier.  Red flags reviewed.

## 2023-10-03 ENCOUNTER — Ambulatory Visit: Payer: Managed Care, Other (non HMO) | Admitting: Family Medicine

## 2023-10-07 ENCOUNTER — Other Ambulatory Visit: Payer: Self-pay | Admitting: Medical Genetics

## 2023-10-10 ENCOUNTER — Other Ambulatory Visit: Payer: Self-pay | Admitting: Family Medicine

## 2023-10-13 ENCOUNTER — Other Ambulatory Visit (HOSPITAL_COMMUNITY): Payer: Managed Care, Other (non HMO)

## 2023-10-15 ENCOUNTER — Other Ambulatory Visit (HOSPITAL_COMMUNITY)
Admission: RE | Admit: 2023-10-15 | Discharge: 2023-10-15 | Disposition: A | Discharge status: Home / Self Care | Payer: Self-pay | Source: Ambulatory Visit | Attending: Medical Genetics | Admitting: Medical Genetics

## 2023-10-15 ENCOUNTER — Other Ambulatory Visit: Payer: Self-pay

## 2023-10-16 ENCOUNTER — Other Ambulatory Visit: Payer: Self-pay | Admitting: Family Medicine

## 2023-10-16 NOTE — Telephone Encounter (Signed)
 Requesting rx rf of metformin500mg   Lat written 09/24/2023 one month supply Last OV 10/01/2022 sick visit Last A1C 02/17/2023 = 5.6 Upcoming appt =none

## 2023-10-16 NOTE — Telephone Encounter (Signed)
 Requesting phentermine 37.5mg  refill Last written 09/01/2023 Last OV 10/02/2023 sick visit Upcoming appt =none

## 2023-10-17 ENCOUNTER — Encounter: Payer: Self-pay | Admitting: Family Medicine

## 2023-10-17 MED ORDER — METFORMIN HCL 500 MG PO TABS: ORAL_TABLET | ORAL | 0 refills | Status: DC

## 2023-10-17 MED ORDER — PHENTERMINE HCL 37.5 MG PO CAPS: 37.5000 mg | ORAL_CAPSULE | Freq: Every morning | ORAL | 0 refills | Status: DC

## 2023-10-20 ENCOUNTER — Other Ambulatory Visit: Payer: Self-pay | Admitting: Family Medicine

## 2023-10-31 ENCOUNTER — Other Ambulatory Visit: Payer: Self-pay

## 2023-10-31 MED ORDER — CETIRIZINE HCL 10 MG PO TABS: 10.0000 mg | ORAL_TABLET | Freq: Every day | ORAL | 11 refills | Status: AC

## 2023-11-15 ENCOUNTER — Other Ambulatory Visit: Payer: Self-pay | Admitting: Family Medicine

## 2023-11-16 ENCOUNTER — Encounter: Payer: Self-pay | Admitting: Family Medicine

## 2023-11-17 NOTE — Telephone Encounter (Signed)
 Forwarding message to Dr. Linford Arnold covering Dr. Ashley Royalty

## 2023-11-18 NOTE — Telephone Encounter (Signed)
 I don't know.  I don't see care plan for wt loss in the last 2 OV notes. Looks like acutes.

## 2023-11-19 ENCOUNTER — Ambulatory Visit: Admitting: Family Medicine

## 2023-11-19 ENCOUNTER — Encounter: Payer: Self-pay | Admitting: Family Medicine

## 2023-11-19 VITALS — BP 129/91 | HR 95 | Ht 65.5 in | Wt 177.0 lb

## 2023-11-19 DIAGNOSIS — E063 Autoimmune thyroiditis: Secondary | ICD-10-CM

## 2023-11-19 DIAGNOSIS — R635 Abnormal weight gain: Secondary | ICD-10-CM

## 2023-11-19 DIAGNOSIS — S76312A Strain of muscle, fascia and tendon of the posterior muscle group at thigh level, left thigh, initial encounter: Secondary | ICD-10-CM | POA: Diagnosis not present

## 2023-11-19 MED ORDER — PHENTERMINE HCL 37.5 MG PO CAPS: 37.5000 mg | ORAL_CAPSULE | Freq: Every morning | ORAL | 0 refills | Status: DC

## 2023-11-19 NOTE — Assessment & Plan Note (Signed)
 Given handout for HEP.  May use ice as needed as well.

## 2023-11-19 NOTE — Progress Notes (Signed)
 Debbie Gallegos - 38 y.o. female MRN [id number]  Date of birth: 10-Nov-1985  Subjective Chief Complaint  Patient presents with   Hypertension   Medical Management of Chronic Issues   Hip Pain    HPI Debbie Gallegos is a 38 y.o. female here today for follow up visit.   She reports that she is doing pretty well.  She continues to do well with phentermine.  She feels like she still gets good appetite suppression with this.  She is not experiencing any side effects at this time, although her BP is elevated on initial check.    She continues on levothyroxine and feels pretty good with current strength. Due for updated labs.   Having some pain in the left buttock area.  Worse when trying to cross legs.  Tight feeling.  She did recently start a walking program.  She denies radiation down the leg, weakness, numbness/tingling.   ROS:  A comprehensive ROS was completed and negative except as noted per HPI  Allergies  Allergen Reactions   Phenergan [Promethazine Hcl] Other (See Comments)    Lock jaw, neck stiffness    Past Medical History:  Diagnosis Date   Family history of breast cancer    Family history of kidney cancer    Family history of melanoma    Family history of prostate cancer    PCOS (polycystic ovarian syndrome)     Past Surgical History:  Procedure Laterality Date   CESAREAN SECTION     CHOLECYSTECTOMY, LAPAROSCOPIC     TUBAL LIGATION      Social History   Socioeconomic History   Marital status: Married    Spouse name: Dennisha Mouser   Number of children: 2   Years of education: Not on file   Highest education level: 12th grade  Occupational History   Occupation: Stay At Home Mother  Tobacco Use   Smoking status: Former    Current packs/day: 0.00    Types: Cigarettes    Quit date: 10/11/2015    Years since quitting: 8.1   Smokeless tobacco: Never  Vaping Use   Vaping status: Never Used  Substance and Sexual Activity   Alcohol use: Not Currently   Drug use:  Never   Sexual activity: Yes    Partners: Male    Birth control/protection: None    Comment: Tubal Ligation  Other Topics Concern   Not on file  Social History Narrative   Not on file   Social Drivers of Health   Financial Resource Strain: Low Risk  (02/17/2023)   Overall Financial Resource Strain (CARDIA)    Difficulty of Paying Living Expenses: Not hard at all  Food Insecurity: Low Risk  (03/07/2023)   Received from [facility]   Hunger Vital Sign    Worried About Running Out of Food in the Last Year: Never true    Ran Out of Food in the Last Year: Never true  Transportation Needs: No Transportation Needs (03/24/2023)   PRAPARE - Administrator, Civil Service (Medical): No    Lack of Transportation (Non-Medical): No  Physical Activity: Insufficiently Active (02/17/2023)   Exercise Vital Sign    Days of Exercise per Week: 3 days    Minutes of Exercise per Session: 30 min  Stress: No Stress Concern Present (02/17/2023)   [employer] of Occupational Health - Occupational Stress Questionnaire    Feeling of Stress : Not at all  Social Connections: Moderately Isolated (02/17/2023)   Social Connection and  Isolation Panel [NHANES]    Frequency of Communication with Friends and Family: More than three times a week    Frequency of Social Gatherings with Friends and Family: Once a week    Attends Religious Services: Never    Database administrator or Organizations: No    Attends Engineer, structural: Not on file    Marital Status: Married    Family History  Problem Relation Age of Onset   Hypertension Mother    Diabetes Mother    Stroke Mother    Breast cancer Mother 21       HER2+   Melanoma Father        ? due to agent orange   Other Father        cancerous nasal polyps   Heart disease Maternal Grandmother    Kidney cancer Maternal Grandfather 83       encapsulated   Breast cancer Maternal Aunt 54   Prostate cancer Paternal Grandfather    Uterine  cancer Other 56       MGF's mother    Health Maintenance  Topic Date Due   HIV Screening  Never done   Hepatitis C Screening  Never done   COVID-19 Vaccine (3 - 2024-25 season) 04/13/2023   INFLUENZA VACCINE  03/12/2024   DTaP/Tdap/Td (2 - Td or Tdap) 08/12/2025   Cervical Cancer Screening (HPV/Pap Cotest)  08/31/2026   HPV VACCINES  Aged Out     ----------------------------------------------------------------------------------------------------------------------------------------------------------------------------------------------------------------- Physical Exam BP (!) 129/91   Pulse 95   Ht 5' 5.5" (1.664 m)   Wt 177 lb (80.3 kg)   SpO2 99%   BMI 29.01 kg/m   Physical Exam Constitutional:      Appearance: Normal appearance.  Cardiovascular:     Rate and Rhythm: Normal rate and regular rhythm.  Pulmonary:     Effort: Pulmonary effort is normal.     Breath sounds: Normal breath sounds.  Musculoskeletal:     Cervical back: Neck supple.     Comments: Pain and tightness in piriformis with FABER test.   Neurological:     Mental Status: She is alert.  Psychiatric:        Mood and Affect: Mood normal.        Behavior: Behavior normal.     ------------------------------------------------------------------------------------------------------------------------------------------------------------------------------------------------------------------- Assessment and Plan  Strain of left piriformis muscle Given handout for HEP.  May use ice as needed as well.   Hypothyroidism Update TSH today.   Abnormal weight gain She has done well with phentermine.  Will continue for 1 additional month and I discussed with her taking some time off from this and continuing to work on dietary changes and increased activity to maintain weight loss.    Meds ordered this encounter  Medications   phentermine 37.5 MG capsule    Sig: Take 1 capsule (37.5 mg total) by mouth every  morning.    Dispense:  30 capsule    Refill:  0    Return in about 3 months (around 02/18/2024) for weight management.

## 2023-11-19 NOTE — Assessment & Plan Note (Signed)
Update TSH today.

## 2023-11-19 NOTE — Telephone Encounter (Signed)
Patient scheduled today at 9:30 am.

## 2023-11-19 NOTE — Assessment & Plan Note (Signed)
 She has done well with phentermine.  Will continue for 1 additional month and I discussed with her taking some time off from this and continuing to work on dietary changes and increased activity to maintain weight loss.

## 2023-11-20 LAB — TSH+FREE T4
Free T4: 1.33 ng/dL (ref 0.82–1.77)
TSH: 2.52 u[IU]/mL (ref 0.450–4.500)

## 2023-11-21 ENCOUNTER — Encounter: Payer: Self-pay | Admitting: Family Medicine

## 2023-12-14 ENCOUNTER — Other Ambulatory Visit: Payer: Self-pay | Admitting: Family Medicine

## 2023-12-22 ENCOUNTER — Encounter: Payer: Self-pay | Admitting: Family Medicine

## 2023-12-22 DIAGNOSIS — Z1509 Genetic susceptibility to other malignant neoplasm: Secondary | ICD-10-CM

## 2023-12-25 ENCOUNTER — Other Ambulatory Visit: Payer: Self-pay | Admitting: Family Medicine

## 2023-12-25 ENCOUNTER — Telehealth: Payer: Self-pay | Admitting: Medical Genetics

## 2023-12-25 DIAGNOSIS — Z1509 Genetic susceptibility to other malignant neoplasm: Secondary | ICD-10-CM | POA: Insufficient documentation

## 2023-12-25 MED ORDER — PHENTERMINE HCL 15 MG PO CAPS: 15.0000 mg | ORAL_CAPSULE | ORAL | 0 refills | Status: DC

## 2023-12-25 NOTE — Telephone Encounter (Signed)
 [place] [facility] Positive Result Note 12/25/2023 8:51 PM  FIRST ATTEMPT: Confirmed I was speaking with Debbie Gallegos [id number] by using name and DOB. Informed participant the reason for this call is to provide results for the above study. Results revealed Lynch Syndrome. Genetic counseling was offered and participant requests a call back to schedule. All questions were answered, and participant was thanked for their time and support of the above study. Participant was encouraged to contact [facility] if they have any further questions or concerns.

## 2023-12-29 ENCOUNTER — Telehealth: Payer: Self-pay | Admitting: Medical Genetics

## 2023-12-29 NOTE — Progress Notes (Signed)
 [place] [facility] 12/29/23 10:50 AM  1st ATTEMPT: Confirmed I was speaking with Debbie Gallegos [id number] by using name and DOB. Informed participant the reason for this call is to schedule genetic counseling appointment. Genetic counseling was offered and participant is scheduled. All questions were answered, and participant was thanked for their time and support of the above study. Participant was encouraged to contact [facility] if they have any further questions or concerns.

## 2023-12-30 ENCOUNTER — Telehealth: Payer: Self-pay

## 2023-12-30 NOTE — Telephone Encounter (Signed)
 Called DR Adela Holter office and spoke with Tiffany.  Gave fax number for her to send over report to show mutation for this patient.  Once we receive we will proceed with scheduling new patient appointment.

## 2024-01-01 ENCOUNTER — Telehealth: Payer: Self-pay

## 2024-01-01 NOTE — Telephone Encounter (Signed)
 Copied from CRM [id number]. Topic: General - Other >> Jan 01, 2024  1:30 PM Retta Caster wrote: Reason for CRM: April from Oncology requesting Notes stating on report for Mutation. Needs this ASAP due to can not schedule patient New PT until they get this. Needs call back [phone]

## 2024-01-01 NOTE — Telephone Encounter (Signed)
 Spoke with rep there and asked them to follow up on sending over report that says patient had mutation.

## 2024-01-06 NOTE — Telephone Encounter (Signed)
Left message for patient to call back and schedule new patient appointment.

## 2024-01-06 NOTE — Telephone Encounter (Signed)
 Spoke with the patient regarding the referral to GYN oncology. Patient scheduled as new patient with Dr Daisey Dryer on 01/26/2024. Patient given an arrival time of 10:45am.  Explained to the patient the the doctor will perform a pelvic exam at this visit. Patient given the policy that only one visitor allowed and that visitor must be over 16 yrs are allowed in the Cancer Center. Patient given the address/phone number for the clinic and that the center offers free valet service. Patient aware that masks required.

## 2024-01-06 NOTE — Telephone Encounter (Signed)
 Faxed to [facility] April - oncology - fax # [phone]

## 2024-01-13 ENCOUNTER — Ambulatory Visit: Admitting: Genetic Counselor

## 2024-01-13 ENCOUNTER — Encounter: Payer: Self-pay | Admitting: Genetic Counselor

## 2024-01-13 DIAGNOSIS — Z1501 Genetic susceptibility to malignant neoplasm of breast: Secondary | ICD-10-CM

## 2024-01-13 DIAGNOSIS — Z9189 Other specified personal risk factors, not elsewhere classified: Secondary | ICD-10-CM

## 2024-01-13 NOTE — Progress Notes (Signed)
 PRIMARY PROVIDER:  Adela Holter, DO  PRIMARY REASON FOR VISIT:  1. MSH6 gene mutation positive    Debbie Gallegos, production, a 38 y.o. female, was seen for a [facility] genetic counseling as follow up to her participation in the [place] Study, part of the [facility] Research Program. 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   RELEVANT MEDICAL HISTORY:   Debbie Gallegos is a 38 y.o. female with no personal history of cancer. She reports menarche at age 48, had her first child at age 60, and is premenopausal. She reports taking birth control pills for a couple of months. Mammogram completed 02/2023, density category c. No history of breast biopsy or HRT. Uterus and ovaries intact, does reports tubal ligations and a surgery for a history of PCOS and endometriosis in 2009. She has not had a colonoscopy or endoscopy.   Debbie Gallegos reports a history of increased cholesterol, with LCL-C of 136 mg/dL and total 956 mg/dL. She is not on treatment for high cholesterol.   Past Medical History:  Diagnosis Date   Family history of breast cancer    Family history of kidney cancer    Family history of melanoma    Family history of prostate cancer    PCOS (polycystic ovarian syndrome)     Past Surgical History:  Procedure Laterality Date   CESAREAN SECTION     CHOLECYSTECTOMY, LAPAROSCOPIC     TUBAL LIGATION      Social History   Socioeconomic History   Marital status: Married    Spouse name: Dalaina Tates   Number of children: 2   Years of education: Not on file   Highest education level: 12th grade  Occupational History   Occupation: Stay At Home Mother  Tobacco Use   Smoking status: Former    Current packs/day: 0.00    Types: Cigarettes    Quit date: 10/11/2015    Years since quitting: 8.2   Smokeless tobacco: Never  Vaping Use   Vaping status: Never Used   Substance and Sexual Activity   Alcohol use: Not Currently   Drug use: Never   Sexual activity: Yes    Partners: Male    Birth control/protection: None    Comment: Tubal Ligation  Other Topics Concern   Not on file  Social History Narrative   Not on file   Social Drivers of Health   Financial Resource Strain: Low Risk  (01/09/2024)   Received from [facility]   Overall Financial Resource Strain (CARDIA)    Difficulty of Paying Living Expenses: Not hard at all  Food Insecurity: No Food Insecurity (01/09/2024)   [facility]   Hunger Vital Sign    Worried About Running Out of Food in the Last Year: Never true    Ran Out of Food in the Last Year: Never true  Transportation Needs: No Transportation Needs (01/09/2024)   Received from [facility] - Transportation    Lack of Transportation (Medical): No    Lack of Transportation (Non-Medical): No  Physical Activity: Sufficiently Active (01/09/2024)   Received from [facility]   Exercise Vital Sign    Days of Exercise per Week: 3 days    Minutes of Exercise per Session: 60 min  Stress: No Stress Concern Present (01/09/2024)   Received from [facility]  Occupational Health - Occupational Stress Questionnaire    Feeling of Stress : Not at all  Social Connections: Socially Integrated (01/09/2024)   Received from [facility]   Social Network    How would you rate your social network (family, work, friends)?: Good participation with social networks     FAMILY HISTORY:  We obtained a detailed, 3-generation family history.  Significant diagnoses are listed below: Family History  Problem Relation Age of Onset   Hypertension Mother    Diabetes Mother    Stroke Mother    Breast cancer Mother 43       HER2+   Melanoma Father 62 - 67       ? due to agent orange   Cancer - Other Father 72 - 62       cancerous nasal polyps   Breast cancer Maternal Aunt 54   Heart disease Maternal  Grandmother    Heart attack Maternal Grandmother 66   Kidney cancer Maternal Grandfather 83       encapsulated   Prostate cancer Paternal Grandfather    Heart disease Paternal Grandfather    Uterine cancer Other 71 - 53       MGF's mother   Breast cancer Cousin 39       ER+, PR+, Her2+    Debbie Gallegos is unaware of previous family history of genetic testing. There is no reported Ashkenazi Jewish ancestry. She reports her mother was diagnosed with breast cancer at age 16, living at 78. Her maternal aunt was diagnosed with breast cancer at age 36, living. Her maternal first cousin was diagnosed with breast cancer at age 37. She reports six maternal uncles with heart disease, including a-fib. She reports her maternal grandmother died of a heart attack and cardiomegaly at age 22. Her maternal grandfather was diagnosed with kidney cancer at age 61, deceased. His mother (maternal great-grandmother) was diagnosed with uterine cancer in her 29s, treated with a hysterectomy. Debbie Gallegos reports her father was diagnosed with melanoma and a cancerous nasal polyp, treated with resection. He has a history of agent orange exposure, living at 74. She reports a paternal half-sister diagnosed with heart disease and a benign breast mass, living at 26. She reports her paternal grandfather was diagnosed with heart disease, passed away due to prostate cancer in his early 65s. Her paternal grandmother was diagnosed with a lung mass, COPD, and passed away at age 75. She reports several paternal relatives with elevated levels of cholesterol, but does not believe any of her relatives have required treatment.     GENETIC TEST RESULTS:  Ms. Cunningham participated in the [facility] population genomic screening program. Two likely pathogenic variants were detected in the MSH6 gene called c.3601C>G (p.Leu1201Val) and c.3724C>A (p.Arg1242Ser). Per [facility], these variants are most likely in cis (on the same copy of the gene) and  have been reported to be in complete linkage disequilibrium. This haplotype (section of DNA or variants that are inherited together) has been reported in other individuals with MSH6-related cancers and when identified together are classified as likely pathogenic. There were no pathogenic or likely pathogenic variants detected in other genes reported on in this study.   [facility] Population Screen is a screening test that analyzes 11 genes related to hereditary breast and ovarian cancer syndrome (HBOC), Lynch syndrome, and familial hypercholesterolemia. These include APOB, BRCA1, BRCA2, EPCAM, LDLR, LDLRAP1, PCSK9, PMS2 (excluding exons 11-15), MLH1, MSH2, MSH6. This test reports pathogenic and likely pathogenic variants,  but does not report on variants of unknown significance. The absence of any additional pathogenic variants is reassuring, but does not rule out a hereditary condition. There are other variants and genes associated with heart disease, hereditary cancer and other inherited conditions that were not included in this test. Please see full test report in labs, labeled "[facility] Molecular Screen," dated 10/15/23. A section of the report is included below.     CLINICAL INFORMATION:  Pathogenic/likely pathogenic variants or mutations in MSH6 are associated with a condition called Lynch syndrome. Lynch syndrome increases risk for colorectal, endometrial, ovarian, urothelial, and other cancers. The cancer risks and management recommendations associated with MSH6 mutations are listed below:  Type of Cancer MSH6 Mutation Lifetime Risk Average Lifetime Risk  Colorectal 10-44% 4.1%  Endometrial (uterus) 16-49% 3.1%  Ovarian Up to 1.0-13% 1.1%  Renal pelvis/ureter 0.7-5.5% -   Bladder 1.0-8.2% 2.3%  Gastric (stomach) Up to 1.0-7.9% 0.8%  Small bowel Up to 1.0-4.0% 0.3%  Pancreas 1.4-1.6% 1.7%  Biliary Tract Up to 1% -  Prostate 2.5-11.6% 12.6%  Brain 0.8-1.8% 0.5%   Benign and malignant  skin tumors such as sebaceous adenomas, sebaceous adenocarcinomas, and keratoacanthomas has been reported to be increased among patients with Lynch syndrome. Cumulative lifetime risk specific to MSH6 mutation carriers is not available.  Risk numbers are based on NCCN v4.2024. Risk estimates may evolve over time as new information is learned about MSH6 mutations.    Management Recommendations: Per NCCN v4.2024, Genetic/Familial High Risk Assessment: Colorectal, Endometrial, Gastric.   Colorectal Cancer Screening: High quality colonoscopy at age 18-35 (or 2-5 years prior to the earliest colon cancer if it is diagnosed before age 31) and repeat every 1-3 years Consider using daily aspirin to reduce the risk of colorectal cancer. The decision to use aspirin should be made on an individual basis, including discussion of individual risks, benefits, adverse effects, and childbearing plans with a healthcare provider.   Endometrial Cancer Screening/Risk Reduction: Women should report any abnormal uterine bleeding or postmenopausal bleeding. The evaluation of these symptoms should include an endometrial biopsy. A hysterectomy may be considered. The timing should be individualized based on whether childbearing is complete, comorbidities, and family history. Hysterectomy with  salpingectomy (surgery to remove uterus and fallopian tubes) can be considered starting at age 75, with delayed oophorectomy (surgery to remove ovaries) at 74.  Endometrial cancer screening does not have a proven benefit in women with Lynch Syndrome. However, endometrial biopsy is highly sensitive and specific as a diagnostic procedure. Screening via endometrial biopsy every 1-2 years starting at age 82-35 can be considered. Transvaginal ultrasounds may be considered in postmenopausal women at their clinician's discretion.  Ovarian Cancer Screening/Risk Reduction: A prophylactic bilateral salpingo-oophorectomy (BSO), or having the  ovaries and fallopian tubes removed, may be considered. A BSO is estimated to reduce the risk of ovarian cancer by up to 96%. Timing of a BSO should be individualized based on whether childbearing is complete, menopause status, comorbidities, and family history. Hysterectomy with  salpingectomy (surgery to remove uterus and fallopian tubes) can be considered starting at age 64, with delayed oophorectomy (surgery to remove ovaries) at 33.  Women should be aware of symptoms that might be associated with the development of ovarian cancer including pelvic or abdominal pain, bloating, increased abdominal girth, difficulty eating, early satiety, or urinary frequency or urgency. Symptoms that persist for several weeks and are a change from a woman's baseline should prompt evaluation by her physician. Current data does not support  routine ovarian screening for Lynch syndrome, therefore it may be considered at the clinician's discretion. Screening includes transvaginal ultrasounds and a blood test to measure CA-125 levels every 6-12 months.  Urothelial Cancer Screening: Annual urinalysis starting at age 76-35 may be considered in selected individuals such as those with a family history of urothelial cancer (renal pelvis, ureter, and/or bladder).   Gastric and Small Bowel Cancer Screening: Esophagogastroduodenoscopy (EGD) starting at age 76-40 years and repeat every 2-4 years, preferably performed in conjunction with colonoscopy.  Age of initiation prior to 30 years and/or surveillance interval less than 2 years may be considered based on family history or high-risk endoscopic findings. Random biopsy of the proximal and distal stomach should at minimum be performed on the initial procedure to assess for H. Pylori, autoimmune gastritis, and intestinal metaplasia.  Push enteroscopy can be considered in place of EGD to enhance small bowel visualization. Individuals not undergoing upper endoscopic surveillance should  have one-time noninvasive testing for H. pylori at the time of Lynch Syndrome diagnosis, with treatment indicated if H. pylori is detected.    Pancreatic Cancer Screening: Avoid smoking, heavy alcohol use, and obesity. It has been suggested that pancreatic cancer screening be limited to those with a family history of pancreatic cancer (first- or second-degree relative). Ideally, screening should be performed in experienced centers utilizing a multidisciplinary approach under research conditions. Recommended screening include annual endoscopic ultrasound (preferred) and/or MRI of the pancreas starting at age 33 or 31 years younger than the earliest age diagnosis in the family. Annual concurrent CA19-9 testing should also be considered.  Brain Cancer Screening: Patients should be educated regarding signs and symptoms of neurologic cancer and the importance of prompt reporting of abnormal symptoms to their physicians.  Prostate Cancer Screening: Consider beginning annual PSA blood test and baseline digital rectal exam at age 44.  Skin Manifestations:  Consider skin exam every 1-2 years with a healthcare provider experienced in identifying skin manifestations of Lynch syndrome (such as a dermatologist).   Additional Considerations: Patients of reproductive age should be made aware of options for prenatal diagnosis and assisted reproduction including pre-implantation genetic diagnosis. Individuals with a single pathogenic MSH6 variant are also carriers of constitutional mismatch repair deficiency (CMMRD) syndrome. CMMRD is a childhood-onset cancer predisposition syndrome that can present with hematological malignancies, cancers of the brain and central nervous system, Lynch syndrome-associated cancers (colon, uterine, small bowel, urinary tract), embryonic tumors, and sarcomas. Some affected individuals may also display cafe-au-lait macules. For there to be a risk of CMMRD in offspring, an individual and  their partner would each have to have a single pathogenic variant in the same MMR gene; in such a case, the risk of having an affected child is 25%.  This information is based on current understanding of the gene and may change in the future.    IMPLICATIONS FOR FAMILY MEMBERS: Individuals with MSH6 are encouraged to share information about their genetic test results with family members. A family letter will be provided to help share this result with relatives. "Cascade screening" is a systematic process through which additional family members with a MSH6 mutation are identified, starting with all first degree relatives over the age of 58 (parents, siblings, and children) of people who have a MSH6 mutation.   Hereditary predisposition to cancer due to pathogenic variants in the MSH6 gene has autosomal dominant inheritance. This means that an individual with a pathogenic variant has a 50% chance of passing the condition on to their children.  Identification of a pathogenic variant allows for the recognition of at-risk relatives who can pursue testing for the familial variant.  Family members are encouraged to consider genetic testing for this familial pathogenic variant. As there are generally no childhood cancer risks associated with pathogenic variants in the MSH6 gene, individuals in the family are not typically recommended to have testing until they reach at least 40 years of age. They may contact our office at [phone] for more information or to schedule an appointment. Family members who live outside of the area are encouraged to find a genetic counselor in their area by visiting: [email or web address].  RESOURCES: Facing Our Risk of Cancer Empowered (FORCE): www.facingourrisk.org   Colon Cancer Alliance for Research and Education for Lynch Syndrome: SnitchSeek.be  ICARE Inherited Cancer Registry: https://inheritedcancer.net/  ADDITIONAL TESTING AND RISK  ASSESSMENT: Ms. Hamid does meet NCCN BOPP criteria for further genes testing of genes associated with hereditary breast cancer. However, we discussed that testing a family member affected with breast cancer would provide helpful information in determining the utility of additional testing and could help us  to better understand Ms. Winfree's risk for breast cancer. She plans to contact family to determine if affected relatives have had testing and encourage her mother to consider a genetic counseling appointment to discuss options for diagnostic testing. Further testing for Ms. Restivo could be considered as well.   Ms. Wunschel has been determined to be at high risk for breast cancer, as her Tyrer-Cuzick risk score is 26%.  For women with a greater than 20% lifetime risk of breast cancer, the [employer] (NCCN) recommends the following:   Clinical encounter every 6-12 months to begin when identified as being at increased risk, but not before age 32  Annual mammograms. Tomosynthesis is recommended starting 10 years earlier than the youngest breast cancer diagnosis in the family or at age 57 (whichever comes first), but not before age 38   Annual breast MRI starting 10 years earlier than the youngest breast cancer diagnosis in the family or at age 40 (whichever comes first), but not before age 54.   We, therefore, discussed that it is reasonable for Ms. Brandow to be followed by a high-risk breast cancer clinic; in addition to a yearly mammogram and physical exam by a healthcare provider, she should discuss the usefulness of an annual breast MRI with the high-risk clinic providers.     PLAN:  Referral to Gastroenterology is recommended to further discuss options for colon cancer screening and prevention. Referral placed by Dr. Augustus Ledger.   Referral to [facility] Gynecologic Oncology is recommended to further discuss options for gynecologic cancer prevention. Referral  placed by Dr. Augustus Ledger.   Referral to High Risk Breast Clinic is recommended to discuss her options for breast cancer surveillance based on her family history. Referral placed per patient request.   Results will be shared with Ms. Landgren's primary care provider, Adela Holter, DO for further management.   Genetic test results should be shared with relatives, who can consider undergoing genetic testing for the MSH6 likely pathogenic variants.   Lastly, we encouraged Ms. Gause to remain in contact with [facility]'s genetics team so that we can continuously update the family history and inform her of any changes in our understanding of Lynch syndrome and any additional genetic testing that may be of benefit for this family.   Ms. Rost questions were answered to her satisfaction today. Our contact information was provided should additional questions or concerns  arise.   Jobie Mulders, MS, [facility] Licensed, Certified Advertising [email or web address] phone: [phone]  90 minutes were spent on the date of the encounter in service to the patient including preparation, face-to-face consultation, documentation and care coordination. The patient's mother was present during this call.   I connected with  Ms. Mccolm on 01/13/2024 at 10:35 EDT by telephone and verified that I am speaking with the correct person using two identifiers.   Patient location: home Provider location: [place], [place]  __________________________________________________________________ For Office Staff:  Number of people involved in session: 2 Was an Intern/ student involved with case: no

## 2024-01-14 ENCOUNTER — Other Ambulatory Visit: Payer: Self-pay | Admitting: Family Medicine

## 2024-01-15 ENCOUNTER — Telehealth: Payer: Self-pay

## 2024-01-15 ENCOUNTER — Encounter: Payer: Self-pay | Admitting: Gynecologic Oncology

## 2024-01-15 NOTE — Progress Notes (Unsigned)
 GYNECOLOGIC ONCOLOGY NEW PATIENT CONSULTATION   Patient Name: Debbie Gallegos  Patient Age: 38 y.o. Date of Service: 01/16/24 Referring Provider: Adela Holter, DO [facility] [address] [place],  [place] [address]   Primary Care Provider: Adela Holter, DO Consulting Provider: Wiley Hanger, MD   Assessment/Plan:  Premenopausal patient with Lynch syndrome.  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  We discussed that the cumulative risk of endometrial cancer is estimated to be 16-49% in patients with this particular mutation.  The risk of ovarian cancer is estimated to be <1-13%.   Given the risk of endometrial cancer, risk reducing surgery with total hysterectomy can be considered.  There is insufficient evidence to make a recommendation about risk reducing salpingo-oophorectomy although consideration of opportunistic salpingectomy recommended in patients who undergo risk reducing total hysterectomy.  Given her age, NCCN recommends delayed bilateral oophorectomy if she were to undergo total hysterectomy with opportunistic salpingectomy until closer to the age of menopause.  We discussed the screening for endometrial cancer with an endometrial biopsy has not proven to be beneficial although since it is a sensitive and specific test, endometrial biopsy can be considered every 1-2 years or if any change in or abnormal bleeding.  More recently, her bleeding has been regular but she has a history of irregular bleeding in the setting of her PCOS diagnosis.  Given her age, we discussed endometrial biopsy today and she would like to proceed with this.  She has some concerns about cost and insurance coverage.  In the setting of Lynch syndrome, endometrial biopsy is typically covered but there may be additional hospital-based fees.  She was given the CPT codes both for her diagnosis and the procedure.  My office will call  her insurance company to ascertain the cost of the procedure and what she may need to cover out-of-pocket.  I also asked the patient to call her insurance company to ask the same questions.  We can reach out to our financial team to get an estimation of the procedure cost, hospital-based cost, and pathology cost.  If her out-of-pocket cost is going to be high, we discussed getting her into see one of the OB/GYN so at least there would not be a hospital-based cause for the procedure.  We discussed the option of total hysterectomy with bilateral salpingectomy now versus at a later time. The patient is interested in risk reducing surgery in the future but would like to wait until closer to the average age of menopause.    The patient will be scheduled for follow-up later this month to return for an endometrial biopsy once we know what her insurance will cover and out-of-pocket cost.  We discussed the importance of monitoring her menses and updating me if any change to her bleeding (especially abnormal bleeding, intermenstrual bleeding).  A copy of this note was sent to the patient's referring provider.   50 minutes of total time was spent for this patient encounter, including preparation, face-to-face counseling with the patient and coordination of care, and documentation of the encounter.  Wiley Hanger, MD  Division of Gynecologic Oncology  Department of Obstetrics and Gynecology  [facility]  ___________________________________________  Chief Complaint: No chief complaint on file.   History of Present Illness:  Debbie Gallegos is a 38 y.o. y.o. female who is seen in consultation at the request of Adela Holter, DO for an evaluation of newly  diagnosed Lynch syndrome.  In the setting of a strong family history of cancer, the patient recently underwent genetic testing.  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   The patient reports overall doing well.   She endorses a history of irregular bleeding related to her PCOS diagnosis.  Bleeding seems to be more regular when her weight is higher.  At lower weight, her menses are more regular.  She endorses regular cycles the last 2 months.  Bleeding last for about 5 days, heavier the first 3-4 days.  She denies any intermenstrual bleeding.  Overall, she reports feeling good.  Symptoms that she was having before her Hashimoto's diagnosis have improved.  Laterally, her bleeding has decreased with treatment of this.  She endorses normal bowel bladder function.  Denies any abdominal or pelvic pain.  PAST MEDICAL HISTORY:  Past Medical History:  Diagnosis Date   Family history of breast cancer    Family history of kidney cancer    Family history of melanoma    Family history of prostate cancer    Hashimoto's disease    Lynch syndrome    PCOS (polycystic ovarian syndrome)      PAST SURGICAL HISTORY:  Past Surgical History:  Procedure Laterality Date   CESAREAN SECTION     CHOLECYSTECTOMY, LAPAROSCOPIC     TUBAL LIGATION      OB/GYN HISTORY:  OB History  Gravida Para Term Preterm AB Living  2 2      SAB IAB Ectopic Multiple Live Births          # Outcome Date GA Lbr Len/2nd Weight Sex Type Anes PTL Lv  2 Para           1 Para             No LMP recorded. (Menstrual status: Irregular Periods).  Age at menarche: 60  Hx of STDs: denies Last pap: 2023 History of abnormal pap smears: denies  SCREENING STUDIES:  Last mammogram: 2024  Last colonoscopy: Scheduled in July  MEDICATIONS: Outpatient Encounter Medications as of 01/16/2024  Medication Sig   cetirizine  (ZYRTEC ) 10 MG tablet Take 1 tablet (10 mg total) by mouth daily.   levothyroxine  (SYNTHROID ) 50 MCG tablet Take 1 tablet by mouth once daily   metFORMIN  (GLUCOPHAGE ) 500 MG tablet TAKE 2 TABLETS BY MOUTH IN THE MORNING AND 1 IN THE EVENING FOR  PCOS   phentermine  15 MG capsule Take 1 capsule (15 mg total) by mouth every morning.    Vitamin D-Vitamin K (K2-D3 5000) 5000-90 UNIT-MCG CAPS    ferrous sulfate 325 (65 FE) MG EC tablet Take 325 mg by mouth.   No facility-administered encounter medications on file as of 01/16/2024.    ALLERGIES:  Allergies  Allergen Reactions   Phenergan [Promethazine Hcl] Other (See Comments)    Lock jaw, neck stiffness     FAMILY HISTORY:  Family History  Problem Relation Age of Onset   Hypertension Mother    Diabetes Mother    Stroke Mother    Breast cancer Mother 89       HER2+   Melanoma Father 48 - 4       ? due to agent orange   Cancer - Other Father 33 - 61       cancerous nasal polyps   Breast cancer Maternal Aunt 54   Heart disease Maternal Grandmother    Heart attack Maternal Grandmother 66   Kidney cancer Maternal Grandfather 49  encapsulated   Lung disease Paternal Grandmother    Prostate cancer Paternal Grandfather    Heart disease Paternal Grandfather    Breast cancer Cousin 39       ER+, PR+, Her2+   Heart disease Half-Sister    Uterine cancer Other 68 - 39       MGF's mother     SOCIAL HISTORY:  Social Connections: Socially Integrated (01/09/2024)   Received from [employer]   Social Network    How would you rate your social network (family, work, friends)?: Good participation with social networks    REVIEW OF SYSTEMS:  Denies appetite changes, fevers, chills, fatigue, unexplained weight changes. Denies hearing loss, neck lumps or masses, mouth sores, ringing in ears or voice changes. Denies cough or wheezing.  Denies shortness of breath. Denies chest pain or palpitations. Denies leg swelling. Denies abdominal distention, pain, blood in stools, constipation, diarrhea, nausea, vomiting, or early satiety. Denies pain with intercourse, dysuria, frequency, hematuria or incontinence. Denies hot flashes, pelvic pain, vaginal bleeding or vaginal discharge.   Denies joint pain, back pain or muscle pain/cramps. Denies itching, rash, or  wounds. Denies dizziness, headaches, numbness or seizures. Denies swollen lymph nodes or glands, denies easy bruising or bleeding. Denies anxiety, depression, confusion, or decreased concentration.  Physical Exam:  Vital Signs for this encounter:  Blood pressure (!) 140/86, pulse 83, temperature 97.6 F (36.4 C), resp. rate 20, weight 175 lb 6.4 oz (79.6 kg), SpO2 100%. Body mass index is 28.74 kg/m. General: Alert, oriented, no acute distress.  HEENT: Normocephalic, atraumatic. Sclera anicteric.  Chest: Clear to auscultation bilaterally. No wheezes, rhonchi, or rales. Cardiovascular: Regular rate and rhythm, no murmurs, rubs, or gallops.  Abdomen: Normoactive bowel sounds. Soft, nondistended, nontender to palpation. No masses or hepatosplenomegaly appreciated. No palpable fluid wave.  Extremities: Grossly normal range of motion. Warm, well perfused. No edema bilaterally.  Skin: No rashes or lesions.  Lymphatics: No cervical, supraclavicular, or inguinal adenopathy.  GU:  Normal external female genitalia. No lesions. No discharge or bleeding.             Bladder/urethra:  No lesions or masses, well supported bladder             Vagina: Well-rugated, no lesions.             Cervix: Normal appearing, no lesions.             Uterus:  Mildly enlarged, mobile, no parametrial involvement or nodularity.             Adnexa: No masses appreciated.  Rectal: Deferred.  LABORATORY AND RADIOLOGIC DATA:  Outside medical records were reviewed to synthesize the above history, along with the history and physical obtained during the visit.   Lab Results  Component Value Date   WBC 5.2 09/18/2022   HGB 13.6 09/18/2022   HCT 40.0 09/18/2022   PLT 318 09/18/2022   GLUCOSE 87 09/18/2022   CHOL 217 (H) 09/18/2022   TRIG 83 09/18/2022   HDL 63 09/18/2022   LDLCALC 136 (H) 09/18/2022   ALT 14 09/18/2022   AST 12 09/18/2022   NA 142 09/18/2022   K 4.6 09/18/2022   CL 106 09/18/2022   CREATININE  0.76 09/18/2022   BUN 12 09/18/2022   CO2 29 09/18/2022   TSH 2.520 11/19/2023   HGBA1C 5.6 02/17/2023

## 2024-01-15 NOTE — Telephone Encounter (Signed)
 Spoke with the patient regarding the referral to GYN oncology. Patient scheduled as new patient with Dr Orvil Bland on 01/16/2024. Patient given an arrival time of 8:30am.  Explained to the patient the the doctor will perform a pelvic exam at this visit. Patient given the policy that only one visitor allowed and that visitor must be over 16 yrs are allowed in the Cancer Center. Patient given the address/phone number for the clinic and that the center offers free valet service. Patient aware that masks required.   This was originally scheduled on 6/16 with Dr Daisey Dryer.  Was moved on 6/5 to 6/6.Aaron AasAaron Aas

## 2024-01-16 ENCOUNTER — Encounter: Payer: Self-pay | Admitting: Gynecologic Oncology

## 2024-01-16 ENCOUNTER — Other Ambulatory Visit: Payer: Self-pay | Admitting: Family Medicine

## 2024-01-16 ENCOUNTER — Inpatient Hospital Stay: Attending: Gynecologic Oncology | Admitting: Gynecologic Oncology

## 2024-01-16 VITALS — BP 140/86 | HR 83 | Temp 97.6°F | Resp 20 | Wt 175.4 lb

## 2024-01-16 DIAGNOSIS — E282 Polycystic ovarian syndrome: Secondary | ICD-10-CM

## 2024-01-16 DIAGNOSIS — Z1509 Genetic susceptibility to other malignant neoplasm: Secondary | ICD-10-CM

## 2024-01-16 NOTE — Patient Instructions (Addendum)
 Procedural code for endometrial biopsy is 58100. The diagnosis would be lynch syndrome (MSH6 gene mutation positive) which is ICD 10 code Z15.01 and Z 15.09.  Plan to follow up in several weeks for an endometrial biopsy. If too costly, Dr. Orvil Bland will reach out to a gynecologist to get this arranged for you.   We will find out the charge for this and let you know. Would also recommend reaching out to your insurance company to see where you are in regards to deductible etc.

## 2024-01-20 ENCOUNTER — Encounter: Payer: Self-pay | Admitting: Gynecologic Oncology

## 2024-01-26 ENCOUNTER — Ambulatory Visit: Admitting: Psychiatry

## 2024-01-26 ENCOUNTER — Other Ambulatory Visit: Payer: Self-pay | Admitting: Gynecologic Oncology

## 2024-01-26 DIAGNOSIS — Z1509 Genetic susceptibility to other malignant neoplasm: Secondary | ICD-10-CM

## 2024-02-05 ENCOUNTER — Other Ambulatory Visit: Payer: Self-pay | Admitting: Family Medicine

## 2024-02-05 DIAGNOSIS — Z1231 Encounter for screening mammogram for malignant neoplasm of breast: Secondary | ICD-10-CM

## 2024-02-05 IMAGING — DX DG CHEST 2V
2 series · 2 of 2 positions shown · non-contrast
Comparison: None.

CLINICAL DATA: 36-year-old female with a history of cough and
congestion

EXAM:
CHEST - 2 VIEW

[chest pa]
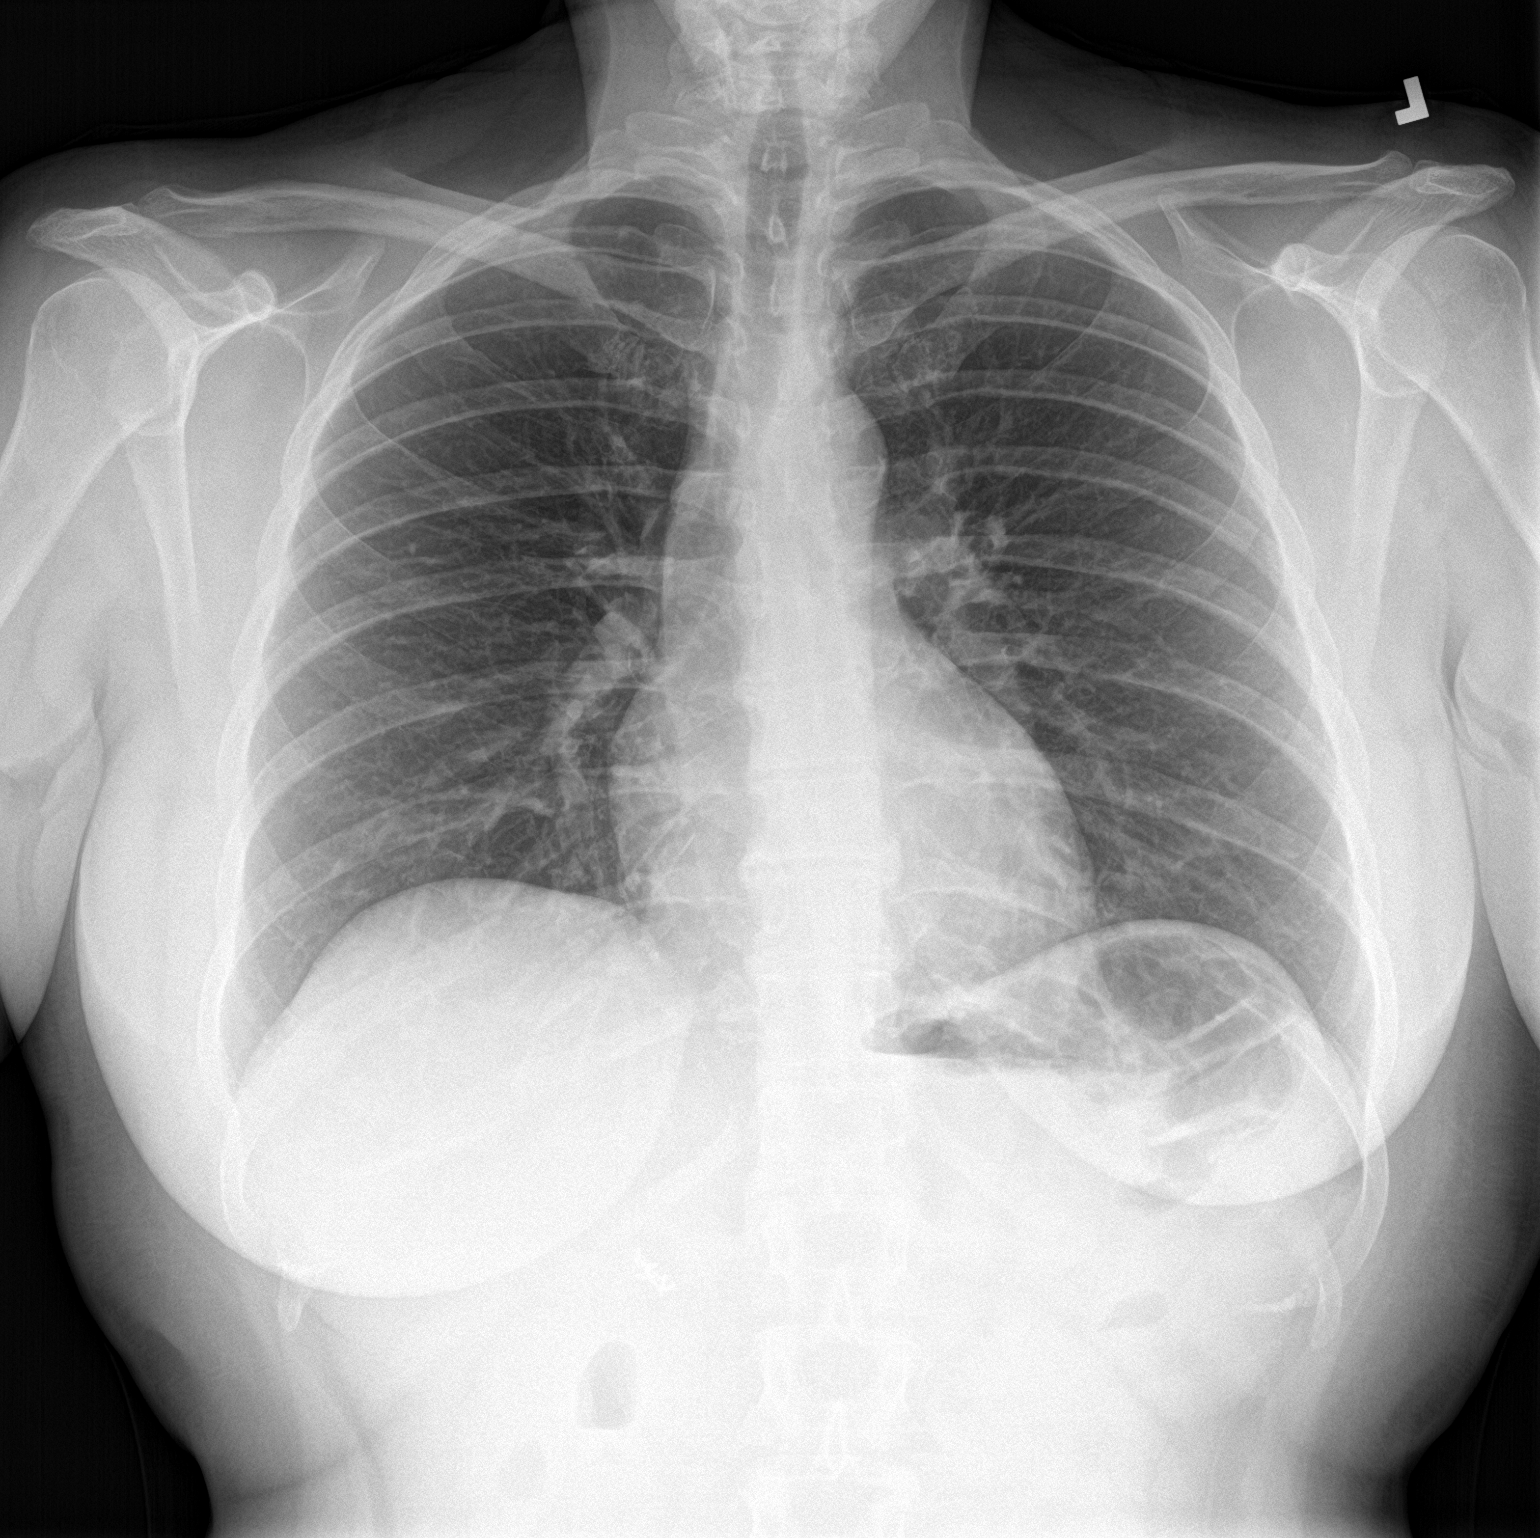

[chest lat]
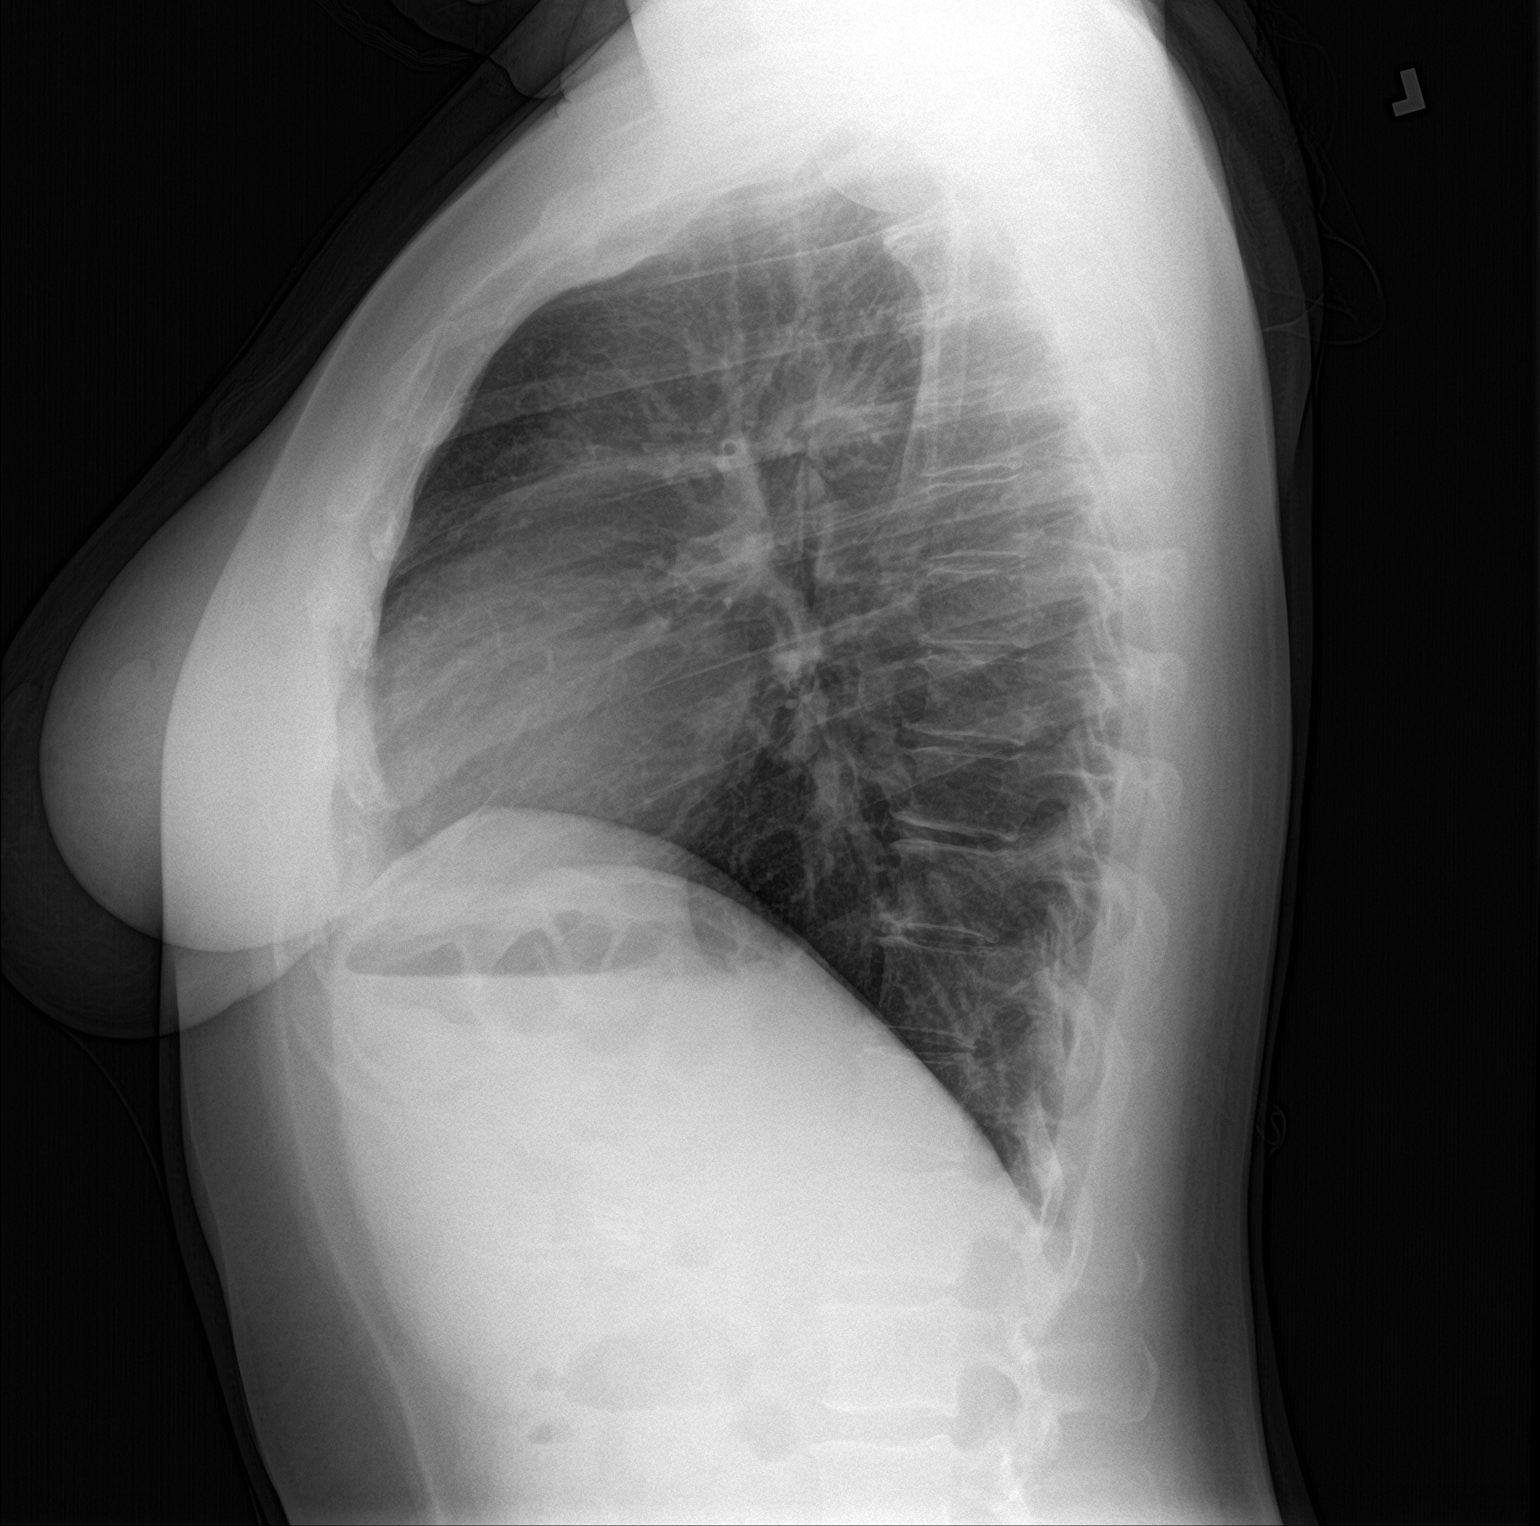

[2 of 2 positions shown; findings below may reference images not displayed]

FINDINGS: Cardiomediastinal silhouette within normal limits in size and
contour. No evidence of central vascular congestion. No interlobular
septal thickening. No pneumothorax or pleural effusion. No confluent
airspace disease.

No acute displaced fracture
IMPRESSION: No active cardiopulmonary disease.

## 2024-02-06 IMAGING — CT CT RENAL STONE PROTOCOL
2 of 4 series · 15 of 46 positions shown, 17 images · non-contrast
Comparison: None.

CLINICAL DATA: Right lower quadrant pain and flank pain,
microhematuria, no history of renal stones



[Series 2: axial st · axial · 0.67mm/px · z∈[-474,-54]mm · 12 of 96 slices shown, 14 images]
[im 8/96  soft-tissue]
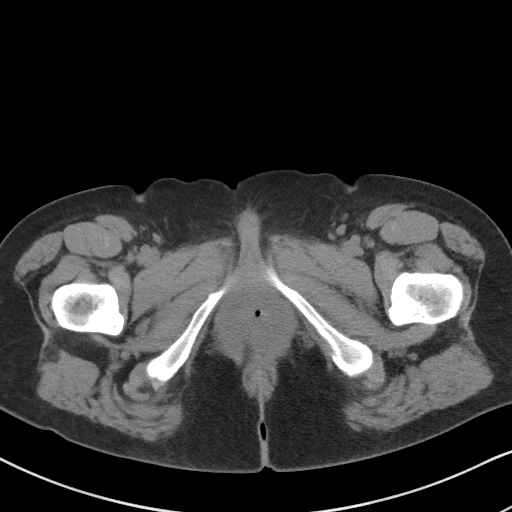
[im 8/96  bone]
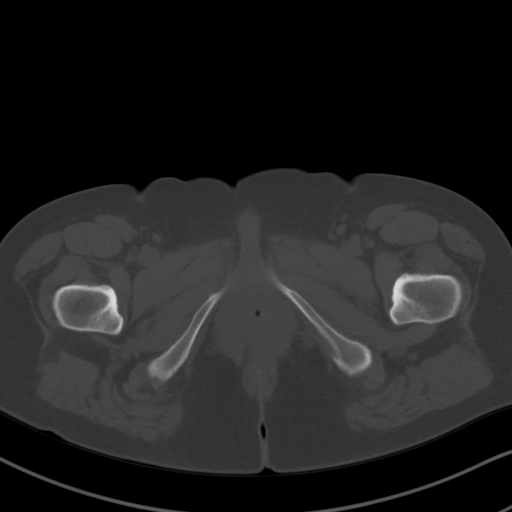
[im 16/96  soft-tissue]
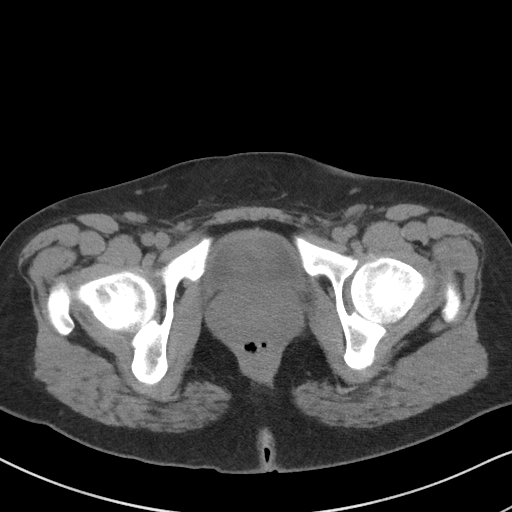
[im 23/96  soft-tissue]
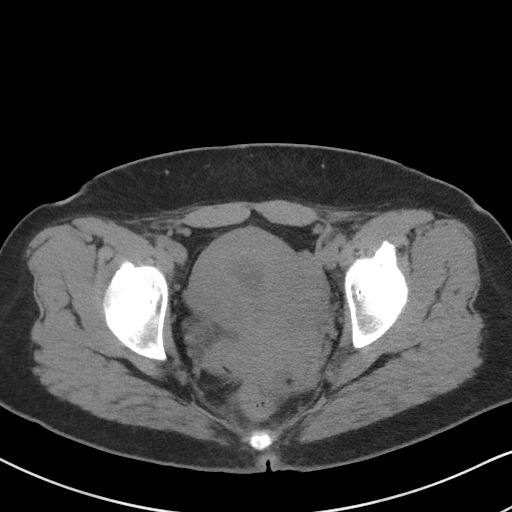
[im 31/96  soft-tissue]
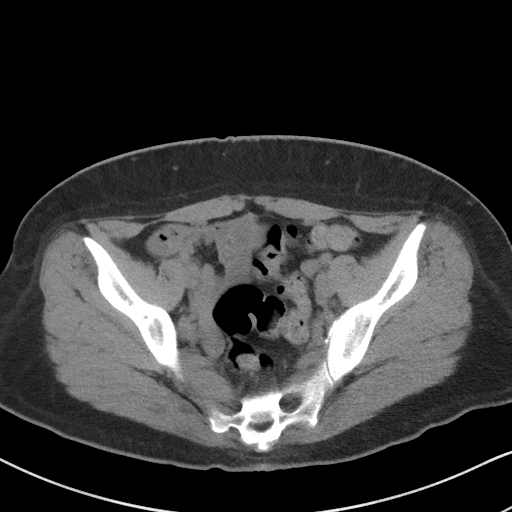
[im 39/96  soft-tissue]
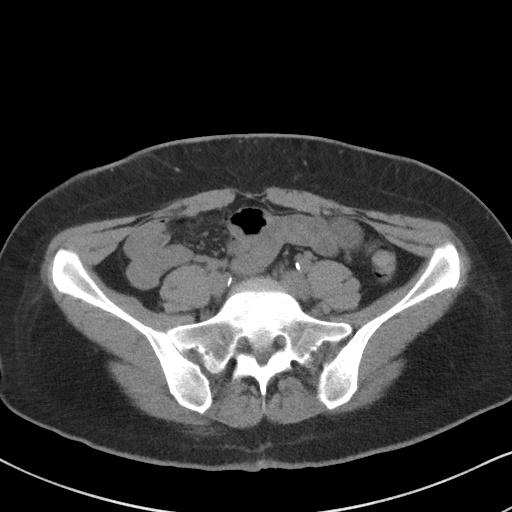
[im 46/96  soft-tissue]
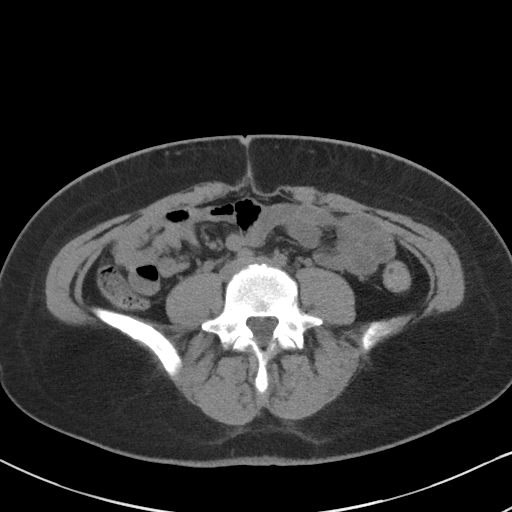
[im 54/96  soft-tissue]
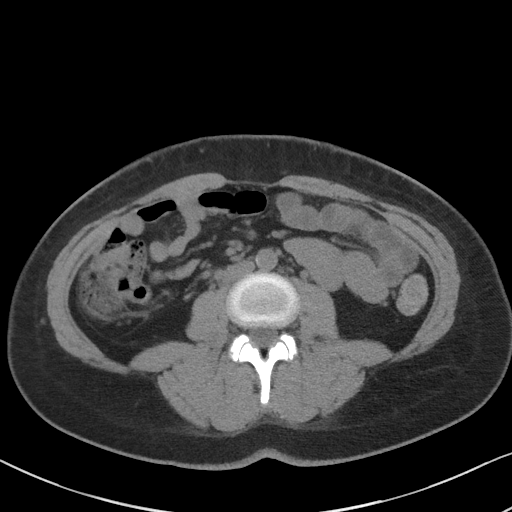
[im 61/96  soft-tissue]
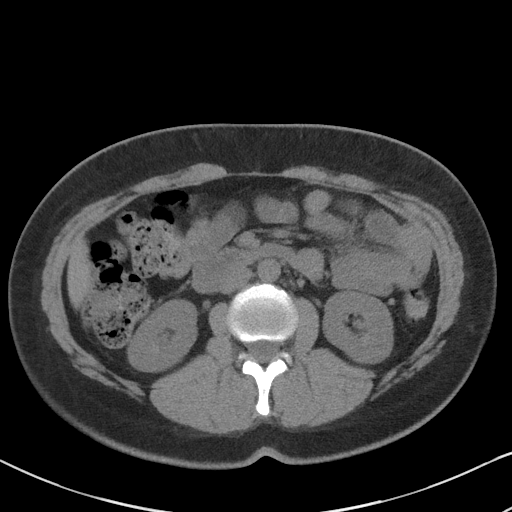
[im 69/96  soft-tissue]
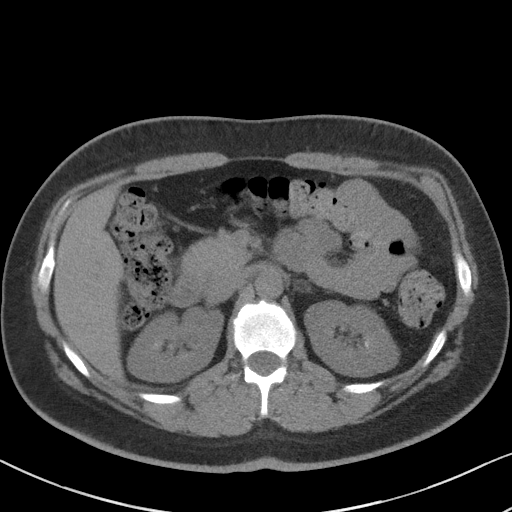
[im 69/96  bone]
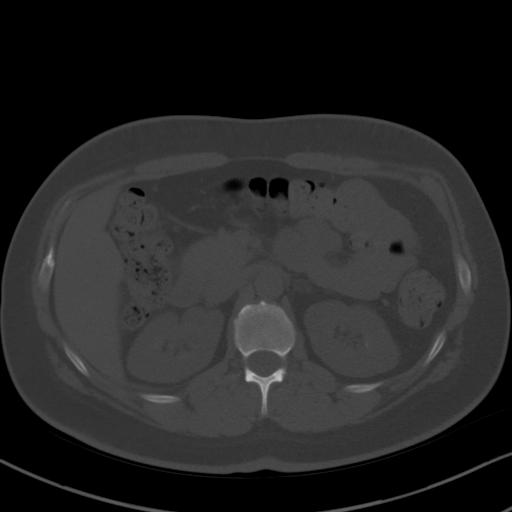
[im 77/96  soft-tissue]
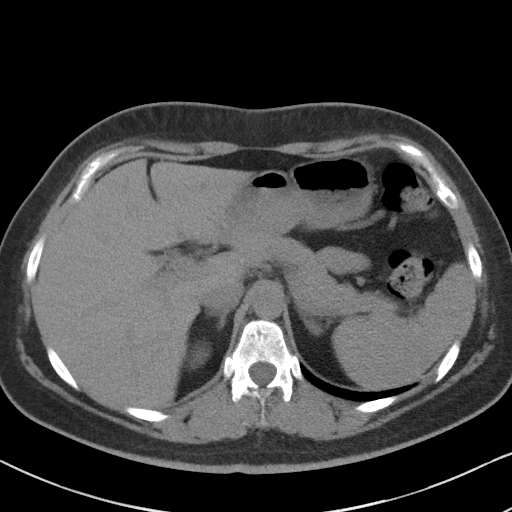
[im 84/96  soft-tissue]
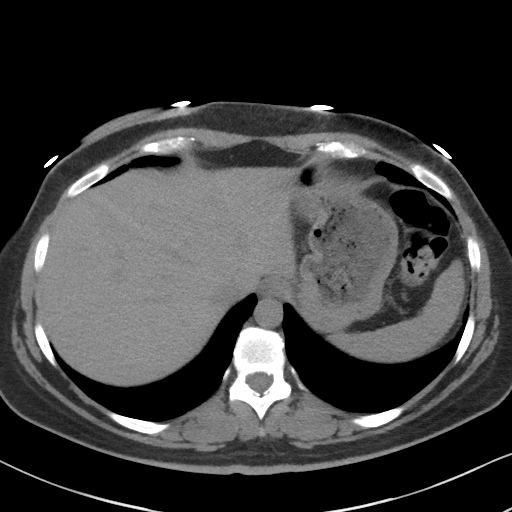
[im 92/96  soft-tissue]
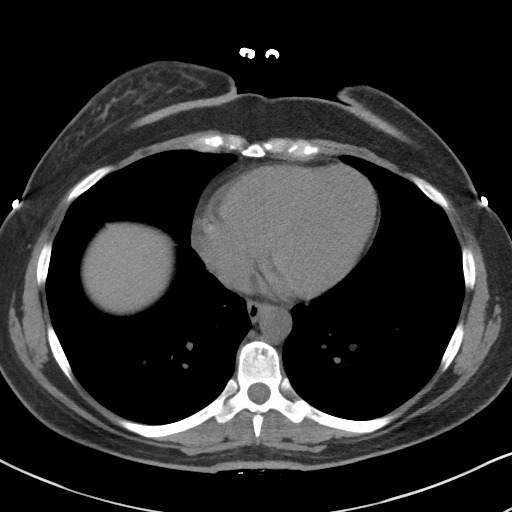

[Series 4: coronal st · coronal · 0.67mm/px · 3 of 77 slices shown]
[im 26/77  soft-tissue]
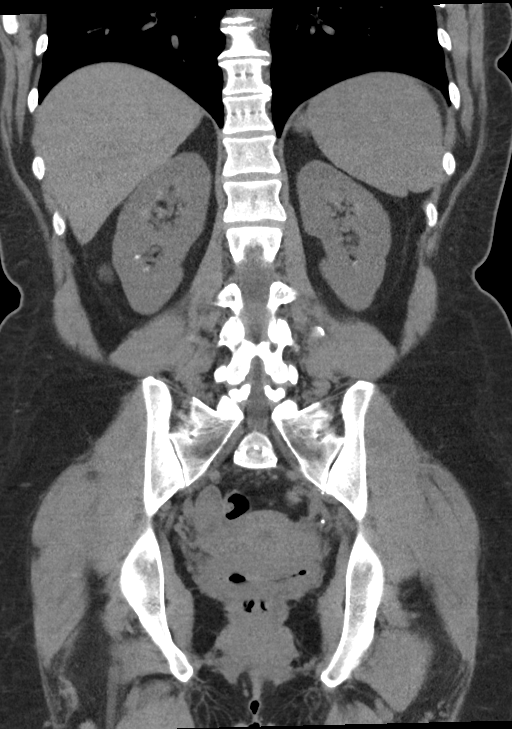
[im 34/77  soft-tissue]
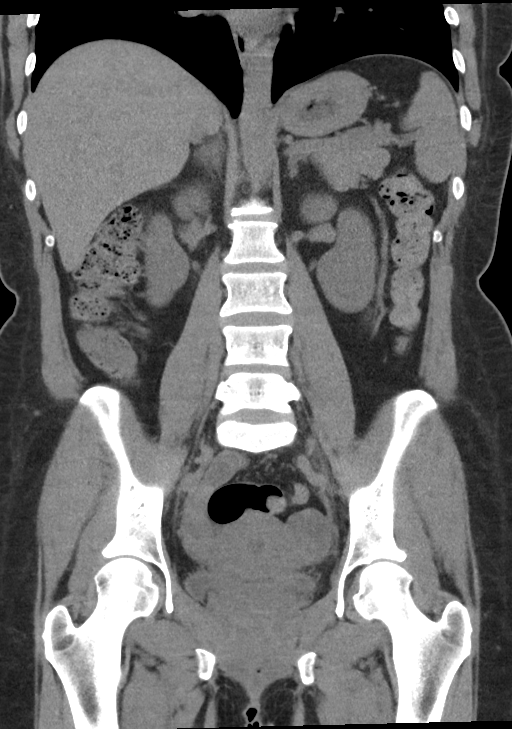
[im 43/77  soft-tissue]
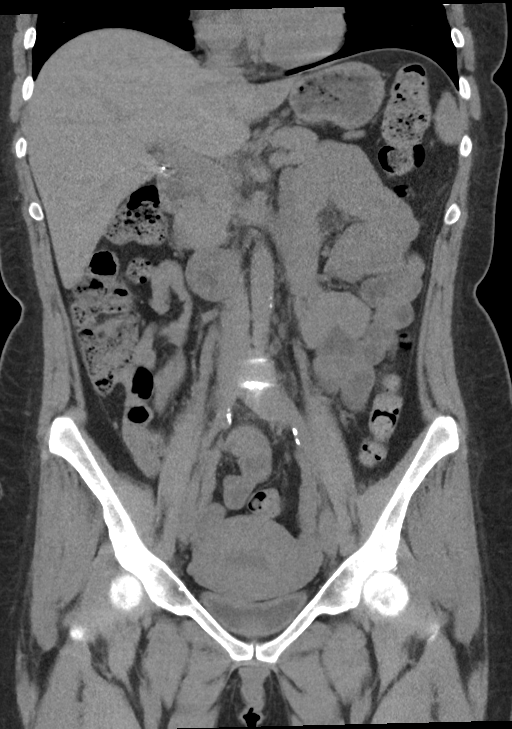

[15 of 46 positions shown; findings below may reference images not displayed]

FINDINGS: Lower chest: No acute abnormality.

Hepatobiliary: No focal liver abnormality is seen. Status post
cholecystectomy. No biliary dilatation.

Pancreas: Unremarkable. No pancreatic ductal dilatation or
surrounding inflammatory changes.

Spleen: Normal in size without significant abnormality.

Adrenals/Urinary Tract: Adrenal glands are unremarkable. There is a
0.4 cm calculus in the most distal right ureter (series 2, image
76). No significant hydronephrosis. Multiple additional small
bilateral renal calculi. Bladder is unremarkable.

Stomach/Bowel: Stomach is within normal limits. Appendix appears
normal. No evidence of bowel wall thickening, distention, or
inflammatory changes.

Vascular/Lymphatic: Aortic atherosclerosis. No enlarged abdominal or
pelvic lymph nodes.

Reproductive: No mass or other significant abnormality. Functional
fluid in the endometrial cavity. 2.7 cm simple cyst of the left
ovary. No follow-up imaging recommended. Note: This recommendation
does not apply to premenarchal patients and to those with increased
risk (genetic, family history, elevated tumor markers or other
high-risk factors) of ovarian cancer. Reference: JACR [DATE]):[id number]

Other: No abdominal wall hernia or abnormality. No ascites.

Musculoskeletal: No acute or significant osseous findings.
IMPRESSION: 1. There is a 0.4 cm calculus in the most distal right ureter. No
significant hydronephrosis.
2. Multiple additional small bilateral renal calculi.
3. Aortic atherosclerosis, advanced for patient age.

These results will be called to the ordering clinician or
representative by the Radiologist Assistant, and communication
documented in the PACS or [REDACTED].

Aortic Atherosclerosis ([id number]).

## 2024-02-10 ENCOUNTER — Inpatient Hospital Stay: Attending: Hematology and Oncology | Admitting: Hematology and Oncology

## 2024-02-10 VITALS — BP 117/74 | HR 83 | Temp 98.1°F | Resp 16 | Ht 65.5 in | Wt 177.7 lb

## 2024-02-10 DIAGNOSIS — Z803 Family history of malignant neoplasm of breast: Secondary | ICD-10-CM | POA: Diagnosis present

## 2024-02-10 DIAGNOSIS — Z800 Family history of malignant neoplasm of digestive organs: Secondary | ICD-10-CM | POA: Diagnosis not present

## 2024-02-10 DIAGNOSIS — Z1239 Encounter for other screening for malignant neoplasm of breast: Secondary | ICD-10-CM | POA: Diagnosis present

## 2024-02-10 DIAGNOSIS — Z9189 Other specified personal risk factors, not elsewhere classified: Secondary | ICD-10-CM

## 2024-02-10 NOTE — Progress Notes (Signed)
 [facility] CONSULT NOTE  Patient Care Team: Alvia Bring, DO as PCP - General (Family Medicine)  CHIEF COMPLAINTS/PURPOSE OF CONSULTATION:  At high risk for breast cancer, MSH6  HISTORY OF PRESENTING ILLNESS:   Debbie Gallegos is a 38 year old who has extensive family history of breast cancer including her mother and 2 aunts as well as a cousin.  She underwent genetic testing which surprisingly detected MSH6 gene mutation.  She does have some family history of uterine cancer and colon cancer.  She was referred to us  regarding breast cancer risk reduction and to discuss breast cancer surveillance options.   I reviewed her records extensively and collaborated the history with the patient.  MEDICAL HISTORY:  Past Medical History:  Diagnosis Date   Family history of breast cancer    Family history of kidney cancer    Family history of melanoma    Family history of prostate cancer    Hashimoto's disease    Lynch syndrome    PCOS (polycystic ovarian syndrome)     SURGICAL HISTORY: Past Surgical History:  Procedure Laterality Date   CESAREAN SECTION     CHOLECYSTECTOMY, LAPAROSCOPIC     TUBAL LIGATION      SOCIAL HISTORY: Social History   Socioeconomic History   Marital status: Married    Spouse name: Agusta Hackenberg   Number of children: 2   Years of education: Not on file   Highest education level: 12th grade  Occupational History   Occupation: Stay At Home Mother  Tobacco Use   Smoking status: Former    Current packs/day: 0.00    Types: Cigarettes    Quit date: 10/11/2015    Years since quitting: 8.3   Smokeless tobacco: Never  Vaping Use   Vaping status: Never Used  Substance and Sexual Activity   Alcohol use: Not Currently    Comment: Occ   Drug use: Never   Sexual activity: Yes    Partners: Male    Birth control/protection: None    Comment: Tubal Ligation  Other Topics Concern   Not on file  Social History Narrative   Not on file   Social  Drivers of Health   Financial Resource Strain: Low Risk  (01/09/2024)   Received from [facility]   Overall Financial Resource Strain (CARDIA)    Difficulty of Paying Living Expenses: Not hard at all  Food Insecurity: No Food Insecurity (01/09/2024)   Received from [facility]   Hunger Vital Sign    Within the past 12 months, you worried that your food would run out before you got the money to buy more.: Never true    Within the past 12 months, the food you bought just didn't last and you didn't have money to get more.: Never true  Transportation Needs: No Transportation Needs (01/09/2024)   Received from [facility] - Transportation    Lack of Transportation (Medical): No    Lack of Transportation (Non-Medical): No  Physical Activity: Sufficiently Active (01/09/2024)   Received from [facility]   Exercise Vital Sign    On average, how many days per week do you engage in moderate to strenuous exercise (like a brisk walk)?: 3 days    On average, how many minutes do you engage in exercise at this level?: 60 min  Stress: No Stress Concern Present (01/09/2024)   Received from [facility] - Occupational Stress Questionnaire    Feeling of  Stress : Not at all  Social Connections: Socially Integrated (01/09/2024)   Received from [facility]   Social Network    How would you rate your social network (family, work, friends)?: Good participation with social networks  Intimate Partner Violence: Not At Risk (01/09/2024)   Received from [facility]   HITS    Over the last 12 months how often did your partner physically hurt you?: Never    Over the last 12 months how often did your partner insult you or talk down to you?: Never    Over the last 12 months how often did your partner threaten you with physical harm?: Never    Over the last 12 months how often did your partner scream or curse at you?: Never    FAMILY HISTORY: Family  History  Problem Relation Age of Onset   Hypertension Mother    Diabetes Mother    Stroke Mother    Breast cancer Mother 84       HER2+   Melanoma Father 20 - 49       ? due to agent orange   Cancer - Other Father 30 - 69       cancerous nasal polyps   Breast cancer Maternal Aunt 54   Heart disease Maternal Grandmother    Heart attack Maternal Grandmother 66   Kidney cancer Maternal Grandfather 83       encapsulated   Lung disease Paternal Grandmother    Prostate cancer Paternal Grandfather    Heart disease Paternal Grandfather    Breast cancer Cousin 39       ER+, PR+, Her2+   Heart disease Half-Sister    Uterine cancer Other 76 - 75       MGF's mother    ALLERGIES:  is allergic to phenergan [promethazine hcl].  MEDICATIONS:  Current Outpatient Medications  Medication Sig Dispense Refill   cetirizine  (ZYRTEC ) 10 MG tablet Take 1 tablet (10 mg total) by mouth daily. 30 tablet 11   ferrous sulfate 325 (65 FE) MG EC tablet Take 325 mg by mouth.     levothyroxine  (SYNTHROID ) 50 MCG tablet Take 1 tablet by mouth once daily 90 tablet 0   metFORMIN  (GLUCOPHAGE ) 500 MG tablet TAKE 2 TABLETS BY MOUTH IN THE MORNING AND 1 IN THE EVENING FOR  PCOS 90 tablet 0   Na Sulfate-K Sulfate-Mg Sulfate concentrate (SUPREP) 17.5-3.13-1.6 GM/177ML SOLN 1 kit as directed.     Vitamin D-Vitamin K (K2-D3 5000) 5000-90 UNIT-MCG CAPS      phentermine  15 MG capsule Take 1 capsule (15 mg total) by mouth every morning. (Patient not taking: Reported on 02/10/2024) 30 capsule 0   No current facility-administered medications for this visit.    REVIEW OF SYSTEMS:   Constitutional: Denies fevers, chills or abnormal night sweats  All other systems were reviewed with the patient and are negative.  PHYSICAL EXAMINATION: ECOG PERFORMANCE STATUS: 1 - Symptomatic but completely ambulatory  Vitals:   02/10/24 1438  BP: 117/74  Pulse: 83  Resp: 16  Temp: 98.1 F (36.7 C)  SpO2: 100%   Filed Weights    02/10/24 1438  Weight: 177 lb 11.2 oz (80.6 kg)    GENERAL:alert, no distress and comfortable  LABORATORY DATA:  I have reviewed the data as listed Lab Results  Component Value Date   WBC 5.2 09/18/2022   HGB 13.6 09/18/2022   HCT 40.0 09/18/2022   MCV 83.2 09/18/2022   PLT 318 09/18/2022  Lab Results  Component Value Date   NA 142 09/18/2022   K 4.6 09/18/2022   CL 106 09/18/2022   CO2 29 09/18/2022    ASSESSMENT AND PLAN:  1.  Breast cancer risk: 5-year risk: 1.1% (average 0.6%) 10-year risk: 3.9% (average risk 1.6%) Lifetime risk: 25.1% (average risk 10.8%)  2. Risk reduction strategies: Discussed that risk reducing antiestrogen therapies are not indicated  3. Lifestyle modification: We discussed different interventions exercise at least 6 days a week including both aerobic as well as weight-bearing exercises and strength training. He discussed dietary modification including increasing number of servings of fruit and vegetables as well as decreased in number of servings of meat. We discussed the importance of maintaining a good BMI. Certainly patient eliminating or limiting alcohol consumption. We discussed smoking cessation are avoiding starting smoking habits.  4. Surveillance: We discussed the role of breast MRIs versus contrast-enhanced mammograms.  I recommended that we do CEM rather than breast MRIs because of less risk from gadolinium.  I sent an order for the mammogram to be done at the breast center at the end of July..   5.  MSH6: I discussed with the patient about the risks of colon cancer, uterine cancer, GI/gastric cancers and provided her with written literature and recommendations for all of these.  She is getting set up for upper endoscopy and colonoscopy. 7. Followup: Follow-up on an as-needed basis.   All questions were answered. The patient knows to call the clinic with any problems, questions or concerns.    Viinay K Ahley Bulls, MD 02/10/24

## 2024-02-14 ENCOUNTER — Other Ambulatory Visit: Payer: Self-pay | Admitting: Family Medicine

## 2024-02-16 ENCOUNTER — Telehealth: Payer: Self-pay | Admitting: *Deleted

## 2024-02-16 NOTE — Telephone Encounter (Signed)
 Spoke with patient who called the office to cancel her follow up appt. With Dr. Viktoria on Thursday, July 10 th. Pt states she will be following up with the ob/gyn that Dr. Viktoria recommended due to insurance coverage reasons.

## 2024-02-19 ENCOUNTER — Inpatient Hospital Stay: Admitting: Gynecologic Oncology

## 2024-03-06 ENCOUNTER — Encounter: Payer: Self-pay | Admitting: Family Medicine

## 2024-03-10 ENCOUNTER — Ambulatory Visit

## 2024-03-11 MED ORDER — PHENTERMINE HCL 15 MG PO CAPS: 15.0000 mg | ORAL_CAPSULE | ORAL | 0 refills | Status: DC

## 2024-03-15 ENCOUNTER — Ambulatory Visit
Admission: RE | Admit: 2024-03-15 | Discharge: 2024-03-15 | Disposition: A | Discharge status: Home / Self Care | Source: Ambulatory Visit | Attending: Hematology and Oncology | Admitting: Hematology and Oncology

## 2024-03-15 DIAGNOSIS — Z9189 Other specified personal risk factors, not elsewhere classified: Secondary | ICD-10-CM

## 2024-03-15 MED ORDER — IOPAMIDOL (ISOVUE-370) INJECTION 76%
100.0000 mL | Freq: Once | INTRAVENOUS | Status: AC | PRN
  Administered 2024-03-15: 100 mL via INTRAVENOUS

## 2024-03-21 ENCOUNTER — Other Ambulatory Visit: Payer: Self-pay | Admitting: Family Medicine

## 2024-03-22 NOTE — Telephone Encounter (Signed)
 Pls contact pt to schedule wt. Mgmt & metformin  appt with Dr. Alvia. Past due. Thx

## 2024-04-10 ENCOUNTER — Other Ambulatory Visit: Payer: Self-pay | Admitting: Family Medicine

## 2024-04-13 ENCOUNTER — Other Ambulatory Visit: Payer: Self-pay | Admitting: Family Medicine

## 2024-04-13 ENCOUNTER — Ambulatory Visit: Admitting: Family Medicine

## 2024-04-13 VITALS — BP 128/74 | HR 70 | Ht 65.5 in | Wt 179.0 lb

## 2024-04-13 DIAGNOSIS — E282 Polycystic ovarian syndrome: Secondary | ICD-10-CM | POA: Diagnosis not present

## 2024-04-13 DIAGNOSIS — J0140 Acute pansinusitis, unspecified: Secondary | ICD-10-CM | POA: Insufficient documentation

## 2024-04-13 DIAGNOSIS — E039 Hypothyroidism, unspecified: Secondary | ICD-10-CM | POA: Diagnosis not present

## 2024-04-13 MED ORDER — PREDNISONE 20 MG PO TABS: 20.0000 mg | ORAL_TABLET | Freq: Two times a day (BID) | ORAL | 0 refills | Status: AC

## 2024-04-13 MED ORDER — DOXYCYCLINE HYCLATE 100 MG PO TABS: 100.0000 mg | ORAL_TABLET | Freq: Two times a day (BID) | ORAL | 0 refills | Status: AC

## 2024-04-13 NOTE — Assessment & Plan Note (Signed)
Continues on metformin for management of insulin resistance related to PCOS.  She will also continue on phentermine at current strength to help with appetite suppression and weight management.

## 2024-04-13 NOTE — Assessment & Plan Note (Signed)
Update TSH today.

## 2024-04-13 NOTE — Progress Notes (Signed)
 Debbie Gallegos - 38 y.o. female MRN [id number]  Date of birth: 08-15-1985  Subjective Chief Complaint  Patient presents with   Nasal Congestion    HPI Debbie Gallegos is a 38 y.o. female here today for follow up visit.   She has complaint of sinus pain and pressure.  Symptoms started about 3 to 4 days ago.  Pressure is pretty severe.  She has thick and purulent mucus.  Denies fever or chills.  Mild headaches.  Denies any chest congestion, body aches or increased fatigue.  Continues on phentermine  15 mg daily.  She is struggling to maintain weight but is working on dietary changes.  She denies side effects of current strength.  Current treatment levothyroxine  she is working pretty well.  She is taking this daily.   ROS:  A comprehensive ROS was completed and negative except as noted per HPI  Allergies  Allergen Reactions   Phenergan [Promethazine Hcl] Other (See Comments)    Lock jaw, neck stiffness    Past Medical History:  Diagnosis Date   Family history of breast cancer    Family history of kidney cancer    Family history of melanoma    Family history of prostate cancer    Hashimoto's disease    Lynch syndrome    PCOS (polycystic ovarian syndrome)     Past Surgical History:  Procedure Laterality Date   CESAREAN SECTION     CHOLECYSTECTOMY, LAPAROSCOPIC     TUBAL LIGATION      Social History   Socioeconomic History   Marital status: Married    Spouse name: Jenessa Gillingham   Number of children: 2   Years of education: Not on file   Highest education level: 12th grade  Occupational History   Occupation: Stay At Home Mother  Tobacco Use   Smoking status: Former    Current packs/day: 0.00    Types: Cigarettes    Quit date: 10/11/2015    Years since quitting: 8.5   Smokeless tobacco: Never  Vaping Use   Vaping status: Never Used  Substance and Sexual Activity   Alcohol use: Not Currently    Comment: Occ   Drug use: Never   Sexual activity: Yes    Partners: Male     Birth control/protection: None    Comment: Tubal Ligation  Other Topics Concern   Not on file  Social History Narrative   Not on file   Social Drivers of Health   Financial Resource Strain: Low Risk  (04/13/2024)   Overall Financial Resource Strain (CARDIA)    Difficulty of Paying Living Expenses: Not hard at all  Food Insecurity: No Food Insecurity (04/13/2024)   Hunger Vital Sign    Worried About Running Out of Food in the Last Year: Never true    Ran Out of Food in the Last Year: Never true  Transportation Needs: No Transportation Needs (04/13/2024)   PRAPARE - Administrator, Civil Service (Medical): No    Lack of Transportation (Non-Medical): No  Physical Activity: Insufficiently Active (04/13/2024)   Exercise Vital Sign    Days of Exercise per Week: 3 days    Minutes of Exercise per Session: 30 min  Stress: No Stress Concern Present (04/13/2024)   [employer] of Occupational Health - Occupational Stress Questionnaire    Feeling of Stress: Not at all  Social Connections: Moderately Isolated (04/13/2024)   Social Connection and Isolation Panel    Frequency of Communication with Friends and Family: More than  three times a week    Frequency of Social Gatherings with Friends and Family: Once a week    Attends Religious Services: Never    Database administrator or Organizations: No    Attends Engineer, structural: Not on file    Marital Status: Married    Family History  Problem Relation Age of Onset   Hypertension Mother    Diabetes Mother    Stroke Mother    Breast cancer Mother 31       HER2+   Melanoma Father 74 - 30       ? due to agent orange   Cancer - Other Father 52 - 69       cancerous nasal polyps   Breast cancer Maternal Aunt 54   Heart disease Maternal Grandmother    Heart attack Maternal Grandmother 66   Kidney cancer Maternal Grandfather 83       encapsulated   Lung disease Paternal Grandmother    Prostate cancer Paternal  Grandfather    Heart disease Paternal Grandfather    Breast cancer Cousin 39       ER+, PR+, Her2+   Heart disease Half-Sister    Uterine cancer Other 58 - 50       MGF's mother    Health Maintenance  Topic Date Due   HIV Screening  Never done   Hepatitis C Screening  Never done   Hepatitis B Vaccines 19-59 Average Risk (1 of 3 - 19+ 3-dose series) Never done   HPV VACCINES (1 - 3-dose SCDM series) Never done   INFLUENZA VACCINE  Never done   COVID-19 Vaccine (3 - 2025-26 season) 04/12/2024   DTaP/Tdap/Td (2 - Td or Tdap) 08/12/2025   Cervical Cancer Screening (HPV/Pap Cotest)  08/31/2026   Pneumococcal Vaccine  Aged Out   Meningococcal B Vaccine  Aged Out     ----------------------------------------------------------------------------------------------------------------------------------------------------------------------------------------------------------------- Physical Exam BP 128/74 (BP Location: Right Arm, Patient Position: Sitting, Cuff Size: Large)   Pulse 70   Ht 5' 5.5 (1.664 m)   Wt 179 lb (81.2 kg)   SpO2 98%   BMI 29.33 kg/m   Physical Exam Constitutional:      Appearance: Normal appearance.  Eyes:     General: No scleral icterus. Cardiovascular:     Rate and Rhythm: Normal rate and regular rhythm.  Pulmonary:     Effort: Pulmonary effort is normal.     Breath sounds: Normal breath sounds.  Musculoskeletal:     Cervical back: Neck supple.  Neurological:     General: No focal deficit present.     Mental Status: She is alert.  Psychiatric:        Mood and Affect: Mood normal.        Behavior: Behavior normal.     ------------------------------------------------------------------------------------------------------------------------------------------------------------------------------------------------------------------- Assessment and Plan  Hypothyroidism Update TSH today.   PCOS (polycystic ovarian syndrome) Continues on metformin  for  management of insulin resistance related to PCOS.  She will also continue on phentermine  at current strength to help with appetite suppression and weight management.  Acute pansinusitis Treated with prednisone  burst and doxycycline .  Continue supportive care with increase fluid intake.  Red flags reviewed.   Meds ordered this encounter  Medications   doxycycline  (VIBRA -TABS) 100 MG tablet    Sig: Take 1 tablet (100 mg total) by mouth 2 (two) times daily.    Dispense:  20 tablet    Refill:  0   predniSONE  (DELTASONE ) 20 MG tablet  Sig: Take 1 tablet (20 mg total) by mouth 2 (two) times daily with a meal for 5 days.    Dispense:  10 tablet    Refill:  0    No follow-ups on file.

## 2024-04-13 NOTE — Assessment & Plan Note (Signed)
 Treated with prednisone  burst and doxycycline .  Continue supportive care with increase fluid intake.  Red flags reviewed.

## 2024-04-14 ENCOUNTER — Ambulatory Visit: Payer: Self-pay | Admitting: Family Medicine

## 2024-04-14 LAB — TSH+FREE T4
Free T4: 1.34 ng/dL (ref 0.82–1.77)
TSH: 2.63 u[IU]/mL (ref 0.450–4.500)

## 2024-04-22 ENCOUNTER — Other Ambulatory Visit: Payer: Self-pay | Admitting: Family Medicine

## 2024-05-19 ENCOUNTER — Encounter: Payer: Self-pay | Admitting: Family Medicine

## 2024-05-19 MED ORDER — PHENTERMINE HCL 37.5 MG PO TABS: ORAL_TABLET | ORAL | 2 refills | Status: AC

## 2024-05-19 NOTE — Telephone Encounter (Signed)
 Please see patient attached message Patient requesting Phentermine  refill with strength increase to 37.5mg   Last written as 15mg   04/19/2024 Last OV 09/02/225 Upcoming appt = none

## 2024-05-25 ENCOUNTER — Ambulatory Visit: Admitting: Family Medicine

## 2024-05-25 VITALS — BP 123/85 | HR 89 | Ht 65.5 in | Wt 181.0 lb

## 2024-05-25 DIAGNOSIS — M5431 Sciatica, right side: Secondary | ICD-10-CM

## 2024-05-25 DIAGNOSIS — M5430 Sciatica, unspecified side: Secondary | ICD-10-CM | POA: Insufficient documentation

## 2024-05-25 MED ORDER — PREDNISONE 10 MG (48) PO TBPK: ORAL_TABLET | Freq: Every day | ORAL | 0 refills | Status: DC

## 2024-05-25 NOTE — Assessment & Plan Note (Signed)
 She is having sciatic pain.  Poss related to piriformis syndrome as most of her pain seems to be focused in the hip and buttock area.  Given handout on home exercises.  Adding prednisone  taper.  Plan for referral to PT if not improving.

## 2024-05-25 NOTE — Progress Notes (Signed)
 Debbie Gallegos - 38 y.o. female MRN [id number]  Date of birth: 1986/06/20  Subjective Chief Complaint  Patient presents with   Hip Pain    Right hip Tingling radiating down right leg    HPI Debbie Gallegos is a 38 year old female here today with complaint of of right hip pain with radiation into the right lower extremity.  This started a few days ago.  She has had similar symptoms in the past is not as severe.  She has pain and tightness in the buttock and outer hip.  She has pain that radiates down the back of her leg and into her calf and foot.  Does have tingling sensation.  Denies numbness or weakness.  Has tried over-the-counter analgesics and anti-inflammatories as well as icing.  ROS:  A comprehensive ROS was completed and negative except as noted per HPI  Allergies  Allergen Reactions   Phenergan [Promethazine Hcl] Other (See Comments)    Lock jaw, neck stiffness    Past Medical History:  Diagnosis Date   Family history of breast cancer    Family history of kidney cancer    Family history of melanoma    Family history of prostate cancer    Hashimoto's disease    Lynch syndrome    PCOS (polycystic ovarian syndrome)     Past Surgical History:  Procedure Laterality Date   CESAREAN SECTION     CHOLECYSTECTOMY, LAPAROSCOPIC     TUBAL LIGATION      Social History   Socioeconomic History   Marital status: Married    Spouse name: Vennela Jutte   Number of children: 2   Years of education: Not on file   Highest education level: 12th grade  Occupational History   Occupation: Stay At Home Mother  Tobacco Use   Smoking status: Former    Current packs/day: 0.00    Types: Cigarettes    Quit date: 10/11/2015    Years since quitting: 8.6   Smokeless tobacco: Never  Vaping Use   Vaping status: Never Used  Substance and Sexual Activity   Alcohol use: Not Currently    Comment: Occ   Drug use: Never   Sexual activity: Yes    Partners: Male    Birth control/protection:  None    Comment: Tubal Ligation  Other Topics Concern   Not on file  Social History Narrative   Not on file   Social Drivers of Health   Financial Resource Strain: Low Risk  (05/25/2024)   Overall Financial Resource Strain (CARDIA)    Difficulty of Paying Living Expenses: Not hard at all  Food Insecurity: No Food Insecurity (05/25/2024)   Hunger Vital Sign    Worried About Running Out of Food in the Last Year: Never true    Ran Out of Food in the Last Year: Never true  Transportation Needs: No Transportation Needs (05/25/2024)   PRAPARE - Administrator, Civil Service (Medical): No    Lack of Transportation (Non-Medical): No  Physical Activity: Insufficiently Active (05/25/2024)   Exercise Vital Sign    Days of Exercise per Week: 2 days    Minutes of Exercise per Session: 30 min  Stress: No Stress Concern Present (05/25/2024)   [employer] of Occupational Health - Occupational Stress Questionnaire    Feeling of Stress: Not at all  Social Connections: Moderately Isolated (05/25/2024)   Social Connection and Isolation Panel    Frequency of Communication with Friends and Family: More than three times a  week    Frequency of Social Gatherings with Friends and Family: Twice a week    Attends Religious Services: Never    Database administrator or Organizations: No    Attends Engineer, structural: Not on file    Marital Status: Married    Family History  Problem Relation Age of Onset   Hypertension Mother    Diabetes Mother    Stroke Mother    Breast cancer Mother 50       HER2+   Melanoma Father 31 - 38       ? due to agent orange   Cancer - Other Father 61 - 69       cancerous nasal polyps   Breast cancer Maternal Aunt 54   Heart disease Maternal Grandmother    Heart attack Maternal Grandmother 66   Kidney cancer Maternal Grandfather 83       encapsulated   Lung disease Paternal Grandmother    Prostate cancer Paternal Grandfather    Heart  disease Paternal Grandfather    Breast cancer Cousin 39       ER+, PR+, Her2+   Heart disease Half-Sister    Uterine cancer Other 29 - 63       MGF's mother    Health Maintenance  Topic Date Due   HIV Screening  Never done   Hepatitis C Screening  Never done   Hepatitis B Vaccines 19-59 Average Risk (1 of 3 - 19+ 3-dose series) Never done   HPV VACCINES (1 - 3-dose SCDM series) Never done   Influenza Vaccine  Never done   COVID-19 Vaccine (3 - 2025-26 season) 04/12/2024   Mammogram  02/18/2025   DTaP/Tdap/Td (2 - Td or Tdap) 08/12/2025   Cervical Cancer Screening (HPV/Pap Cotest)  08/31/2026   Pneumococcal Vaccine  Aged Out   Meningococcal B Vaccine  Aged Out     ----------------------------------------------------------------------------------------------------------------------------------------------------------------------------------------------------------------- Physical Exam BP 123/85 (BP Location: Left Arm, Patient Position: Sitting, Cuff Size: Normal)   Pulse 89   Ht 5' 5.5 (1.664 m)   Wt 181 lb (82.1 kg)   SpO2 98%   BMI 29.66 kg/m   Physical Exam Constitutional:      Appearance: Normal appearance.  Eyes:     General: No scleral icterus. Cardiovascular:     Rate and Rhythm: Normal rate and regular rhythm.  Pulmonary:     Effort: Pulmonary effort is normal.     Breath sounds: Normal breath sounds.  Musculoskeletal:     Cervical back: Neck supple.     Comments: There is palpation along the lateral hip and mid buttock area.  She has pain with internal rotation of the hip.  Mild weakness with hip abduction  Neurological:     Mental Status: She is alert.     ------------------------------------------------------------------------------------------------------------------------------------------------------------------------------------------------------------------- Assessment and Plan  Sciatica She is having sciatic pain.  Poss related to piriformis  syndrome as most of her pain seems to be focused in the hip and buttock area.  Given handout on home exercises.  Adding prednisone  taper.  Plan for referral to PT if not improving.   Meds ordered this encounter  Medications   predniSONE  (STERAPRED UNI-PAK 48 TAB) 10 MG (48) TBPK tablet    Sig: Take by mouth daily. 12-day taper pack, use as directed for taper    Dispense:  48 tablet    Refill:  0    Return in about 4 weeks (around 06/22/2024) for sciatica.

## 2024-06-22 ENCOUNTER — Other Ambulatory Visit: Payer: Self-pay | Admitting: Family Medicine

## 2024-06-22 ENCOUNTER — Ambulatory Visit: Admitting: Family Medicine

## 2024-06-22 ENCOUNTER — Ambulatory Visit: Payer: Self-pay | Admitting: Family Medicine

## 2024-06-22 ENCOUNTER — Encounter: Payer: Self-pay | Admitting: Family Medicine

## 2024-06-22 ENCOUNTER — Ambulatory Visit

## 2024-06-22 VITALS — BP 125/80 | HR 76 | Ht 65.5 in | Wt 180.0 lb

## 2024-06-22 DIAGNOSIS — R7989 Other specified abnormal findings of blood chemistry: Secondary | ICD-10-CM

## 2024-06-22 DIAGNOSIS — M5431 Sciatica, right side: Secondary | ICD-10-CM | POA: Diagnosis not present

## 2024-06-22 MED ORDER — PREDNISONE 10 MG (48) PO TBPK: ORAL_TABLET | Freq: Every day | ORAL | 0 refills | Status: DC

## 2024-06-22 NOTE — Assessment & Plan Note (Signed)
 Recurrent flare sciatica.  Will add an additional course of prednisone .  Imaging of the lumbar spine ordered.  She was given home exercise plan at visit in October and continues to do these.

## 2024-06-22 NOTE — Progress Notes (Signed)
 Geraline Lee - 38 y.o. female MRN [id number]  Date of birth: 21-Jul-1986  Subjective Chief Complaint  Patient presents with   Sciatica    HPI Malonie Tatum is a 38 year old female here today for follow-up visit.  She has flare of sciatica.  She has had this previously and improved with steroids.  It is located on the right side.  There is recall any recent injury that would have contributed to flare.  Denies weakness in the lower extremities.  Has not tried anything so far.   ROS:  A comprehensive ROS was completed and negative except as noted per HPI  Allergies  Allergen Reactions   Phenergan [Promethazine Hcl] Other (See Comments)    Lock jaw, neck stiffness  , Limit  Past Medical History:  Diagnosis Date   Family history of breast cancer    Family history of kidney cancer    Family history of melanoma    Family history of prostate cancer    Hashimoto's disease    Lynch syndrome    PCOS (polycystic ovarian syndrome)     Past Surgical History:  Procedure Laterality Date   CESAREAN SECTION     CHOLECYSTECTOMY, LAPAROSCOPIC     TUBAL LIGATION      Social History   Socioeconomic History   Marital status: Married    Spouse name: Tawni Melkonian   Number of children: 2   Years of education: Not on file   Highest education level: 12th grade  Occupational History   Occupation: Stay At Home Mother  Tobacco Use   Smoking status: Former    Current packs/day: 0.00    Types: Cigarettes    Quit date: 10/11/2015    Years since quitting: 8.7   Smokeless tobacco: Never  Vaping Use   Vaping status: Never Used  Substance and Sexual Activity   Alcohol use: Not Currently    Comment: Occ   Drug use: Never   Sexual activity: Yes    Partners: Male    Birth control/protection: None    Comment: Tubal Ligation  Other Topics Concern   Not on file  Social History Narrative   Not on file   Social Drivers of Health   Financial Resource Strain: Low Risk  (05/25/2024)   Overall  Financial Resource Strain (CARDIA)    Difficulty of Paying Living Expenses: Not hard at all  Food Insecurity: No Food Insecurity (05/25/2024)   Hunger Vital Sign    Worried About Running Out of Food in the Last Year: Never true    Ran Out of Food in the Last Year: Never true  Transportation Needs: No Transportation Needs (05/25/2024)   PRAPARE - Administrator, Civil Service (Medical): No    Lack of Transportation (Non-Medical): No  Physical Activity: Insufficiently Active (05/25/2024)   Exercise Vital Sign    Days of Exercise per Week: 2 days    Minutes of Exercise per Session: 30 min  Stress: No Stress Concern Present (05/25/2024)   [employer] of Occupational Health - Occupational Stress Questionnaire    Feeling of Stress: Not at all  Social Connections: Moderately Isolated (05/25/2024)   Social Connection and Isolation Panel    Frequency of Communication with Friends and Family: More than three times a week    Frequency of Social Gatherings with Friends and Family: Twice a week    Attends Religious Services: Never    Database Administrator or Organizations: No    Attends Banker Meetings:  Not on file    Marital Status: Married    Family History  Problem Relation Age of Onset   Hypertension Mother    Diabetes Mother    Stroke Mother    Breast cancer Mother 2       HER2+   Melanoma Father 43 - 67       ? due to agent orange   Cancer - Other Father 73 - 72       cancerous nasal polyps   Breast cancer Maternal Aunt 54   Heart disease Maternal Grandmother    Heart attack Maternal Grandmother 66   Kidney cancer Maternal Grandfather 83       encapsulated   Lung disease Paternal Grandmother    Prostate cancer Paternal Grandfather    Heart disease Paternal Grandfather    Breast cancer Cousin 39       ER+, PR+, Her2+   Heart disease Half-Sister    Uterine cancer Other 25 - 60       MGF's mother    Health Maintenance  Topic Date Due   HIV  Screening  Never done   Hepatitis C Screening  Never done   Influenza Vaccine  11/09/2024 (Originally 03/12/2024)   Hepatitis B Vaccines 19-59 Average Risk (1 of 3 - 19+ 3-dose series) 06/03/2025 (Originally 08/16/2002)   HPV VACCINES (1 - 3-dose SCDM series) 06/03/2025 (Originally 08/16/2010)   COVID-19 Vaccine (3 - 2025-26 season) 06/19/2025 (Originally 04/12/2024)   Mammogram  02/18/2025   DTaP/Tdap/Td (2 - Td or Tdap) 08/12/2025   Cervical Cancer Screening (HPV/Pap Cotest)  08/31/2026   Pneumococcal Vaccine  Aged Out   Meningococcal B Vaccine  Aged Out     ----------------------------------------------------------------------------------------------------------------------------------------------------------------------------------------------------------------- Physical Exam BP 125/80   Pulse 76   Ht 5' 5.5 (1.664 m)   Wt 180 lb (81.6 kg)   SpO2 100%   BMI 29.50 kg/m   Physical Exam Constitutional:      Appearance: Normal appearance.  Eyes:     General: No scleral icterus. Cardiovascular:     Rate and Rhythm: Normal rate and regular rhythm.  Pulmonary:     Effort: Pulmonary effort is normal.     Breath sounds: Normal breath sounds.  Neurological:     Mental Status: She is alert.  Psychiatric:        Mood and Affect: Mood normal.        Behavior: Behavior normal.     ------------------------------------------------------------------------------------------------------------------------------------------------------------------------------------------------------------------- Assessment and Plan  Sciatica Recurrent flare sciatica.  Will add an additional course of prednisone .  Imaging of the lumbar spine ordered.  She was given home exercise plan at visit in October and continues to do these.   Meds ordered this encounter  Medications   predniSONE  (STERAPRED UNI-PAK 48 TAB) 10 MG (48) TBPK tablet    Sig: Take by mouth daily. 12-day taper pack, use as directed for taper     Dispense:  48 tablet    Refill:  0    No follow-ups on file.

## 2024-07-28 LAB — GENECONNECT MOLECULAR SCREEN: Genetic Analysis Overall Interpretation: POSITIVE — AB

## 2024-08-23 ENCOUNTER — Ambulatory Visit: Admitting: Family Medicine

## 2024-08-23 ENCOUNTER — Ambulatory Visit

## 2024-08-23 ENCOUNTER — Ambulatory Visit: Payer: Self-pay | Admitting: Family Medicine

## 2024-08-23 ENCOUNTER — Encounter: Payer: Self-pay | Admitting: Family Medicine

## 2024-08-23 VITALS — BP 121/79 | HR 95 | Ht 65.5 in | Wt 185.0 lb

## 2024-08-23 DIAGNOSIS — R1030 Lower abdominal pain, unspecified: Secondary | ICD-10-CM | POA: Diagnosis not present

## 2024-08-23 DIAGNOSIS — R10A0 Flank pain, unspecified side: Secondary | ICD-10-CM | POA: Diagnosis not present

## 2024-08-23 DIAGNOSIS — R319 Hematuria, unspecified: Secondary | ICD-10-CM

## 2024-08-23 DIAGNOSIS — R109 Unspecified abdominal pain: Secondary | ICD-10-CM

## 2024-08-23 DIAGNOSIS — R8290 Unspecified abnormal findings in urine: Secondary | ICD-10-CM | POA: Diagnosis not present

## 2024-08-23 DIAGNOSIS — R350 Frequency of micturition: Secondary | ICD-10-CM

## 2024-08-23 DIAGNOSIS — R3129 Other microscopic hematuria: Secondary | ICD-10-CM | POA: Diagnosis not present

## 2024-08-23 LAB — POCT URINALYSIS DIP (CLINITEK)
Bilirubin, UA: NEGATIVE
Glucose, UA: NEGATIVE mg/dL
Ketones, POC UA: NEGATIVE mg/dL
Leukocytes, UA: NEGATIVE
Nitrite, UA: NEGATIVE
POC PROTEIN,UA: NEGATIVE
Spec Grav, UA: 1.025
Urobilinogen, UA: 0.2 U/dL
pH, UA: 6

## 2024-08-23 NOTE — Progress Notes (Signed)
 " Debbie Gallegos - 39 y.o. female MRN [id number]  Date of birth: [birth year]  Subjective Chief Complaint  Patient presents with   Knee Injury   Urinary Frequency    HPI Debbie Gallegos is a 39 y.o. female here today with complaint of bladder pressure, frequency, flank pain.  Her symptoms started yesterday.  Feels similar to when she had stone in the past.  Denies fever or chills.  No nausea at this time.   She also injured her L knee after a recent fall.  Using ice and seems to be improving.  Pain is mostly along the kneecap. Mild swelling.   ROS:  A comprehensive ROS was completed and negative except as noted per HPI  Allergies[1]  Past Medical History:  Diagnosis Date   Family history of breast cancer    Family history of kidney cancer    Family history of melanoma    Family history of prostate cancer    Hashimoto's disease    Lynch syndrome    PCOS (polycystic ovarian syndrome)     Past Surgical History:  Procedure Laterality Date   CESAREAN SECTION     CHOLECYSTECTOMY, LAPAROSCOPIC     TUBAL LIGATION      Social History   Socioeconomic History   Marital status: Married    Spouse name: Julieanne Hadsall   Number of children: 2   Years of education: Not on file   Highest education level: 12th grade  Occupational History   Occupation: Stay At Home Mother  Tobacco Use   Smoking status: Former    Current packs/day: 0.00    Types: Cigarettes    Quit date: 10/11/2015    Years since quitting: 8.8   Smokeless tobacco: Never  Vaping Use   Vaping status: Never Used  Substance and Sexual Activity   Alcohol use: Not Currently    Comment: Occ   Drug use: Never   Sexual activity: Yes    Partners: Male    Birth control/protection: None    Comment: Tubal Ligation  Other Topics Concern   Not on file  Social History Narrative   Not on file   Social Drivers of Health   Tobacco Use: Medium Risk (08/23/2024)   Patient History    Smoking Tobacco Use: Former    Smokeless Tobacco  Use: Never    Passive Exposure: Not on file  Financial Resource Strain: Low Risk (05/25/2024)   Overall Financial Resource Strain (CARDIA)    Difficulty of Paying Living Expenses: Not hard at all  Food Insecurity: No Food Insecurity (05/25/2024)   Epic    Worried About Programme Researcher, Broadcasting/film/video in the Last Year: Never true    Ran Out of Food in the Last Year: Never true  Transportation Needs: No Transportation Needs (05/25/2024)   Epic    Lack of Transportation (Medical): No    Lack of Transportation (Non-Medical): No  Physical Activity: Insufficiently Active (05/25/2024)   Exercise Vital Sign    Days of Exercise per Week: 2 days    Minutes of Exercise per Session: 30 min  Stress: No Stress Concern Present (05/25/2024)   [employer] of Occupational Health - Occupational Stress Questionnaire    Feeling of Stress: Not at all  Social Connections: Moderately Isolated (05/25/2024)   Social Connection and Isolation Panel    Frequency of Communication with Friends and Family: More than three times a week    Frequency of Social Gatherings with Friends and Family: Twice a week  Attends Religious Services: Never    Active Member of Clubs or Organizations: No    Attends Banker Meetings: Not on file    Marital Status: Married  Depression (PHQ2-9): Low Risk (06/22/2024)   Depression (PHQ2-9)    PHQ-2 Score: 0  Alcohol Screen: Low Risk (05/25/2024)   Alcohol Screen    Last Alcohol Screening Score (AUDIT): 3  Housing: Low Risk (05/25/2024)   Epic    Unable to Pay for Housing in the Last Year: No    Number of Times Moved in the Last Year: 0    Homeless in the Last Year: No  Utilities: Not At Risk (01/09/2024)   Received from [facility]    Threatened with loss of utilities: No  Health Literacy: Not on file    Family History  Problem Relation Age of Onset   Hypertension Mother    Diabetes Mother    Stroke Mother    Breast cancer Mother 16        HER2+   Melanoma Father 4 - 65       ? due to agent orange   Cancer - Other Father 48 - 69       cancerous nasal polyps   Breast cancer Maternal Aunt 54   Heart disease Maternal Grandmother    Heart attack Maternal Grandmother 66   Kidney cancer Maternal Grandfather 83       encapsulated   Lung disease Paternal Grandmother    Prostate cancer Paternal Grandfather    Heart disease Paternal Grandfather    Breast cancer Cousin 39       ER+, PR+, Her2+   Heart disease Half-Sister    Uterine cancer Other 37 - 40       MGF's mother    Health Maintenance  Topic Date Due   HIV Screening  Never done   Hepatitis C Screening  Never done   Influenza Vaccine  11/09/2024 (Originally 03/12/2024)   Hepatitis B Vaccines 19-59 Average Risk (1 of 3 - 19+ 3-dose series) 06/03/2025 (Originally 08/16/2002)   HPV VACCINES (1 - Risk 3-dose SCDM series) 06/03/2025 (Originally 08/16/2010)   COVID-19 Vaccine (3 - 2025-26 season) 06/19/2025 (Originally 04/12/2024)   Mammogram  02/18/2025   DTaP/Tdap/Td (2 - Td or Tdap) 08/12/2025   Cervical Cancer Screening (HPV/Pap Cotest)  08/31/2026   Pneumococcal Vaccine  Aged Out   Meningococcal B Vaccine  Aged Out     ----------------------------------------------------------------------------------------------------------------------------------------------------------------------------------------------------------------- Physical Exam BP (!) 144/86 (BP Location: Left Arm, Patient Position: Sitting, Cuff Size: Large)   Pulse 95   Ht 5' 5.5 (1.664 m)   Wt 185 lb (83.9 kg)   SpO2 100%   BMI 30.32 kg/m   Physical Exam Constitutional:      Appearance: Normal appearance.  Eyes:     General: No scleral icterus. Cardiovascular:     Rate and Rhythm: Normal rate and regular rhythm.  Pulmonary:     Effort: Pulmonary effort is normal.     Breath sounds: Normal breath sounds.  Abdominal:     General: There is no distension.     Palpations: Abdomen is soft.      Tenderness: There is no abdominal tenderness. There is no right CVA tenderness or left CVA tenderness.  Neurological:     General: No focal deficit present.     Mental Status: She is alert.  Psychiatric:        Mood and Affect: Mood normal.  Behavior: Behavior normal.     ------------------------------------------------------------------------------------------------------------------------------------------------------------------------------------------------------------------- Assessment and Plan  Lower abdominal pain UA does not appear consistent with UTI.  She does have some blood in her urine and symptoms similar to previous stones. STAT CT stone protocol ordered.  Urine also sent for culture.     No orders of the defined types were placed in this encounter.   No follow-ups on file.        [1]  Allergies Allergen Reactions   Phenergan [Promethazine Hcl] Other (See Comments)    Lock jaw, neck stiffness   "

## 2024-08-23 NOTE — Assessment & Plan Note (Signed)
 UA does not appear consistent with UTI.  She does have some blood in her urine and symptoms similar to previous stones. STAT CT stone protocol ordered.  Urine also sent for culture.

## 2024-08-24 ENCOUNTER — Other Ambulatory Visit: Payer: Self-pay | Admitting: Family Medicine

## 2024-08-24 MED ORDER — TAMSULOSIN HCL 0.4 MG PO CAPS: 0.4000 mg | ORAL_CAPSULE | Freq: Every day | ORAL | 0 refills | Status: AC

## 2024-08-26 LAB — URINE CULTURE

## 2024-08-26 MED ORDER — CEPHALEXIN 500 MG PO CAPS: 500.0000 mg | ORAL_CAPSULE | Freq: Two times a day (BID) | ORAL | 0 refills | Status: AC
